# Patient Record
Sex: Female | Born: 1950 | Race: Black or African American | Hispanic: No | Marital: Married | Smoking: Never smoker
Health system: Southern US, Community
[De-identification: ages and names within clinical notes are randomized; demographics above are authoritative.]

## PROBLEM LIST (undated history)

## (undated) DIAGNOSIS — I82409 Acute embolism and thrombosis of unspecified deep veins of unspecified lower extremity: Secondary | ICD-10-CM

## (undated) DIAGNOSIS — E7800 Pure hypercholesterolemia, unspecified: Secondary | ICD-10-CM

## (undated) DIAGNOSIS — K219 Gastro-esophageal reflux disease without esophagitis: Secondary | ICD-10-CM

## (undated) DIAGNOSIS — M7989 Other specified soft tissue disorders: Secondary | ICD-10-CM

## (undated) DIAGNOSIS — C539 Malignant neoplasm of cervix uteri, unspecified: Secondary | ICD-10-CM

## (undated) DIAGNOSIS — Z972 Presence of dental prosthetic device (complete) (partial): Secondary | ICD-10-CM

## (undated) DIAGNOSIS — R42 Dizziness and giddiness: Secondary | ICD-10-CM

## (undated) DIAGNOSIS — I10 Essential (primary) hypertension: Secondary | ICD-10-CM

## (undated) HISTORY — PX: BREAST EXCISIONAL BIOPSY: SUR124

## (undated) HISTORY — PX: OOPHORECTOMY: SHX86

## (undated) HISTORY — PX: ABDOMINAL HYSTERECTOMY: SHX81

## (undated) HISTORY — PX: COLONOSCOPY: SHX174

---

## 1994-11-29 HISTORY — PX: BREAST EXCISIONAL BIOPSY: SUR124

## 2004-12-01 ENCOUNTER — Ambulatory Visit: Payer: Self-pay | Admitting: Internal Medicine

## 2006-01-20 ENCOUNTER — Ambulatory Visit: Payer: Self-pay | Admitting: Internal Medicine

## 2006-12-07 ENCOUNTER — Ambulatory Visit: Payer: Self-pay | Admitting: Internal Medicine

## 2007-05-04 ENCOUNTER — Ambulatory Visit: Payer: Self-pay | Admitting: Internal Medicine

## 2008-05-07 ENCOUNTER — Ambulatory Visit: Payer: Self-pay | Admitting: Internal Medicine

## 2009-05-22 ENCOUNTER — Ambulatory Visit: Payer: Self-pay | Admitting: Internal Medicine

## 2010-05-27 ENCOUNTER — Ambulatory Visit: Payer: Self-pay | Admitting: Internal Medicine

## 2011-06-24 ENCOUNTER — Ambulatory Visit: Payer: Self-pay | Admitting: Internal Medicine

## 2012-06-26 ENCOUNTER — Ambulatory Visit: Payer: Self-pay | Admitting: Internal Medicine

## 2013-06-11 ENCOUNTER — Emergency Department: Payer: Self-pay | Admitting: Unknown Physician Specialty

## 2013-06-27 ENCOUNTER — Ambulatory Visit: Payer: Self-pay | Admitting: Internal Medicine

## 2014-04-05 ENCOUNTER — Ambulatory Visit: Payer: Self-pay | Admitting: Gastroenterology

## 2014-07-01 ENCOUNTER — Ambulatory Visit: Payer: Self-pay | Admitting: Nurse Practitioner

## 2014-07-03 ENCOUNTER — Ambulatory Visit: Payer: Self-pay | Admitting: Nurse Practitioner

## 2015-06-04 ENCOUNTER — Other Ambulatory Visit: Payer: Self-pay | Admitting: Internal Medicine

## 2015-06-04 DIAGNOSIS — Z1231 Encounter for screening mammogram for malignant neoplasm of breast: Secondary | ICD-10-CM

## 2015-07-03 ENCOUNTER — Ambulatory Visit
Admission: RE | Admit: 2015-07-03 | Discharge: 2015-07-03 | Disposition: A | Discharge status: Home / Self Care | Payer: BLUE CROSS/BLUE SHIELD | Source: Ambulatory Visit | Attending: Internal Medicine | Admitting: Internal Medicine

## 2015-07-03 DIAGNOSIS — Z1231 Encounter for screening mammogram for malignant neoplasm of breast: Secondary | ICD-10-CM | POA: Insufficient documentation

## 2016-06-22 ENCOUNTER — Other Ambulatory Visit: Payer: Self-pay | Admitting: Nurse Practitioner

## 2016-06-22 DIAGNOSIS — Z1231 Encounter for screening mammogram for malignant neoplasm of breast: Secondary | ICD-10-CM

## 2016-07-06 ENCOUNTER — Other Ambulatory Visit: Payer: Self-pay | Admitting: Nurse Practitioner

## 2016-07-06 ENCOUNTER — Ambulatory Visit
Admission: RE | Admit: 2016-07-06 | Discharge: 2016-07-06 | Disposition: A | Discharge status: Home / Self Care | Payer: BLUE CROSS/BLUE SHIELD | Source: Ambulatory Visit | Attending: Nurse Practitioner | Admitting: Nurse Practitioner

## 2016-07-06 DIAGNOSIS — Z1231 Encounter for screening mammogram for malignant neoplasm of breast: Secondary | ICD-10-CM

## 2017-05-31 ENCOUNTER — Other Ambulatory Visit: Payer: Self-pay | Admitting: Nurse Practitioner

## 2017-05-31 DIAGNOSIS — Z1231 Encounter for screening mammogram for malignant neoplasm of breast: Secondary | ICD-10-CM

## 2017-07-07 ENCOUNTER — Ambulatory Visit
Admission: RE | Admit: 2017-07-07 | Discharge: 2017-07-07 | Disposition: A | Discharge status: Home / Self Care | Payer: BLUE CROSS/BLUE SHIELD | Source: Ambulatory Visit | Attending: Nurse Practitioner | Admitting: Nurse Practitioner

## 2017-07-07 DIAGNOSIS — Z1231 Encounter for screening mammogram for malignant neoplasm of breast: Secondary | ICD-10-CM | POA: Insufficient documentation

## 2017-10-05 ENCOUNTER — Other Ambulatory Visit: Payer: Self-pay

## 2017-10-10 NOTE — Discharge Instructions (Signed)

## 2017-10-11 ENCOUNTER — Ambulatory Visit
Admission: RE | Admit: 2017-10-11 | Discharge: 2017-10-11 | Disposition: A | Discharge status: Home / Self Care | Payer: BLUE CROSS/BLUE SHIELD | Source: Ambulatory Visit | Attending: Ophthalmology | Admitting: Ophthalmology

## 2017-10-11 ENCOUNTER — Ambulatory Visit: Payer: BLUE CROSS/BLUE SHIELD | Admitting: Student in an Organized Health Care Education/Training Program

## 2017-10-11 ENCOUNTER — Encounter
Admission: RE | Disposition: A | Discharge status: Home / Self Care | Payer: Self-pay | Source: Ambulatory Visit | Attending: Ophthalmology

## 2017-10-11 DIAGNOSIS — H2512 Age-related nuclear cataract, left eye: Secondary | ICD-10-CM | POA: Insufficient documentation

## 2017-10-11 DIAGNOSIS — K219 Gastro-esophageal reflux disease without esophagitis: Secondary | ICD-10-CM | POA: Insufficient documentation

## 2017-10-11 DIAGNOSIS — H401121 Primary open-angle glaucoma, left eye, mild stage: Secondary | ICD-10-CM | POA: Diagnosis not present

## 2017-10-11 DIAGNOSIS — I10 Essential (primary) hypertension: Secondary | ICD-10-CM | POA: Diagnosis not present

## 2017-10-11 HISTORY — DX: Gastro-esophageal reflux disease without esophagitis: K21.9

## 2017-10-11 HISTORY — DX: Presence of dental prosthetic device (complete) (partial): Z97.2

## 2017-10-11 HISTORY — DX: Dizziness and giddiness: R42

## 2017-10-11 HISTORY — DX: Pure hypercholesterolemia, unspecified: E78.00

## 2017-10-11 HISTORY — DX: Essential (primary) hypertension: I10

## 2017-10-11 HISTORY — PX: CATARACT EXTRACTION W/PHACO: SHX586

## 2017-10-11 HISTORY — DX: Acute embolism and thrombosis of unspecified deep veins of unspecified lower extremity: I82.409

## 2017-10-11 SURGERY — PHACOEMULSIFICATION, CATARACT, WITH IOL INSERTION
Anesthesia: General | Laterality: Left | Wound class: Clean

## 2017-10-11 MED ORDER — BSS IO SOLN
INTRAOCULAR | Status: DC | PRN
  Administered 2017-10-11: 72 mL via OPHTHALMIC

## 2017-10-11 MED ORDER — MIDAZOLAM HCL 2 MG/2ML IJ SOLN
INTRAMUSCULAR | Status: DC | PRN
  Administered 2017-10-11: 2 mg via INTRAVENOUS

## 2017-10-11 MED ORDER — SODIUM HYALURONATE 10 MG/ML IO SOLN
INTRAOCULAR | Status: DC | PRN
  Administered 2017-10-11: 0.55 mL via INTRAOCULAR

## 2017-10-11 MED ORDER — SODIUM HYALURONATE 23 MG/ML IO SOLN
INTRAOCULAR | Status: DC | PRN
  Administered 2017-10-11: 0.6 mL via INTRAOCULAR

## 2017-10-11 MED ORDER — MEPERIDINE HCL 25 MG/ML IJ SOLN: 6.2500 mg | INTRAMUSCULAR | Status: DC | PRN

## 2017-10-11 MED ORDER — OXYCODONE HCL 5 MG/5ML PO SOLN: 5.0000 mg | Freq: Once | ORAL | Status: DC | PRN

## 2017-10-11 MED ORDER — ARMC OPHTHALMIC DILATING DROPS
1.0000 "application " | OPHTHALMIC | Status: DC | PRN
  Administered 2017-10-11 (×3): 1 via OPHTHALMIC

## 2017-10-11 MED ORDER — BUPIVACAINE HCL (PF) 0.75 % IJ SOLN
INTRAMUSCULAR | Status: DC | PRN
  Administered 2017-10-11: 3 mL via OPHTHALMIC

## 2017-10-11 MED ORDER — PROMETHAZINE HCL 25 MG/ML IJ SOLN: 6.2500 mg | INTRAMUSCULAR | Status: DC | PRN

## 2017-10-11 MED ORDER — LIDOCAINE HCL (PF) 2 % IJ SOLN
INTRAOCULAR | Status: DC | PRN
  Administered 2017-10-11: 1 mL via INTRAOCULAR

## 2017-10-11 MED ORDER — ALFENTANIL 500 MCG/ML IJ INJ
INJECTION | INTRAVENOUS | Status: DC | PRN
  Administered 2017-10-11: 1000 ug via INTRAVENOUS

## 2017-10-11 MED ORDER — ERYTHROMYCIN 5 MG/GM OP OINT
TOPICAL_OINTMENT | OPHTHALMIC | Status: DC | PRN
  Administered 2017-10-11: 1 via OPHTHALMIC

## 2017-10-11 MED ORDER — FENTANYL CITRATE (PF) 100 MCG/2ML IJ SOLN
25.0000 ug | INTRAMUSCULAR | Status: DC | PRN
  Administered 2017-10-11: 50 ug via INTRAVENOUS

## 2017-10-11 MED ORDER — MOXIFLOXACIN HCL 0.5 % OP SOLN
OPHTHALMIC | Status: DC | PRN
  Administered 2017-10-11: 1 [drp] via OPHTHALMIC

## 2017-10-11 MED ORDER — LACTATED RINGERS IV SOLN: 10.0000 mL/h | INTRAVENOUS | Status: DC

## 2017-10-11 MED ORDER — OXYCODONE HCL 5 MG PO TABS: 5.0000 mg | ORAL_TABLET | Freq: Once | ORAL | Status: DC | PRN

## 2017-10-11 SURGICAL SUPPLY — 18 items
CANNULA ANT/CHMB 27GA (MISCELLANEOUS) ×3 IMPLANT
DISSECTOR HYDRO NUCLEUS 50X22 (MISCELLANEOUS) ×3 IMPLANT
GLOVE BIO SURGEON STRL SZ8 (GLOVE) ×3 IMPLANT
GLOVE SURG LX 7.5 STRW (GLOVE) ×2
GLOVE SURG LX STRL 7.5 STRW (GLOVE) ×1 IMPLANT
GOWN STRL REUS W/ TWL LRG LVL3 (GOWN DISPOSABLE) ×2 IMPLANT
GOWN STRL REUS W/TWL LRG LVL3 (GOWN DISPOSABLE) ×4
ISTENT OPTHAL 1X.33 ADULT LF (Permanent Stent) ×3 IMPLANT
LENS IOL TECNIS ITEC 22.5 (Intraocular Lens) ×3 IMPLANT
MARKER SKIN DUAL TIP RULER LAB (MISCELLANEOUS) ×3 IMPLANT
PACK CATARACT (MISCELLANEOUS) ×3 IMPLANT
PACK DR. KING ARMS (PACKS) ×3 IMPLANT
PACK EYE AFTER SURG (MISCELLANEOUS) ×3 IMPLANT
STENT 'ISTENT OPTH 1X.33 ADLT (Permanent Stent) ×1 IMPLANT
SYR 3ML LL SCALE MARK (SYRINGE) ×3 IMPLANT
SYR TB 1ML LUER SLIP (SYRINGE) ×3 IMPLANT
WATER STERILE IRR 500ML POUR (IV SOLUTION) ×3 IMPLANT
WIPE NON LINTING 3.25X3.25 (MISCELLANEOUS) ×3 IMPLANT

## 2017-10-11 NOTE — Anesthesia Postprocedure Evaluation (Signed)
Anesthesia Post Note  Patient: Teresa Cunningham  Procedure(s) Performed: CATARACT EXTRACTION PHACO AND INTRAOCULAR LENS PLACEMENT (IOC) LEFT ISTENT (Left )  Patient location during evaluation: PACU Anesthesia Type: General Level of consciousness: awake and alert Pain management: pain level controlled Vital Signs Assessment: post-procedure vital signs reviewed and stable Respiratory status: spontaneous breathing, nonlabored ventilation, respiratory function stable and patient connected to nasal cannula oxygen Cardiovascular status: blood pressure returned to baseline and stable Postop Assessment: no apparent nausea or vomiting Anesthetic complications: no    Domique Clapper ELAINE

## 2017-10-11 NOTE — Transfer of Care (Signed)
Immediate Anesthesia Transfer of Care Note  Patient: Teresa Cunningham  Procedure(s) Performed: CATARACT EXTRACTION PHACO AND INTRAOCULAR LENS PLACEMENT (IOC) LEFT ISTENT (Left )  Patient Location: PACU  Anesthesia Type: General  Level of Consciousness: awake, alert  and patient cooperative  Airway and Oxygen Therapy: Patient Spontanous Breathing and Patient connected to supplemental oxygen  Post-op Assessment: Post-op Vital signs reviewed, Patient's Cardiovascular Status Stable, Respiratory Function Stable, Patent Airway and No signs of Nausea or vomiting  Post-op Vital Signs: Reviewed and stable  Complications: No apparent anesthesia complications

## 2017-10-11 NOTE — Anesthesia Preprocedure Evaluation (Signed)
Anesthesia Evaluation  Patient identified by MRN, date of birth, ID band Patient awake    Reviewed: H&P , NPO status , Patient's Chart, lab work & pertinent test results, reviewed documented beta blocker date and time   History of Anesthesia Complications Negative for: history of anesthetic complications  Airway Mallampati: II  TM Distance: >3 FB Neck ROM: full    Dental no notable dental hx.    Pulmonary neg pulmonary ROS,    Pulmonary exam normal breath sounds clear to auscultation       Cardiovascular Exercise Tolerance: Good hypertension,  Rhythm:regular Rate:Normal     Neuro/Psych negative neurological ROS  negative psych ROS   GI/Hepatic Neg liver ROS, GERD  ,  Endo/Other  negative endocrine ROS  Renal/GU negative Renal ROS  negative genitourinary   Musculoskeletal   Abdominal   Peds  Hematology negative hematology ROS (+)   Anesthesia Other Findings   Reproductive/Obstetrics negative OB ROS                            Anesthesia Physical Anesthesia Plan  ASA: II  Anesthesia Plan: General   Post-op Pain Management:    Induction:   PONV Risk Score and Plan:   Airway Management Planned:   Additional Equipment:   Intra-op Plan:   Post-operative Plan:   Informed Consent: I have reviewed the patients History and Physical, chart, labs and discussed the procedure including the risks, benefits and alternatives for the proposed anesthesia with the patient or authorized representative who has indicated his/her understanding and acceptance.   Dental Advisory Given  Plan Discussed with: CRNA  Anesthesia Plan Comments:         Anesthesia Quick Evaluation

## 2017-10-11 NOTE — Anesthesia Procedure Notes (Signed)
Procedure Name: MAC Performed by: Orvil Faraone, CRNA Pre-anesthesia Checklist: Patient identified, Emergency Drugs available, Suction available, Timeout performed and Patient being monitored Patient Re-evaluated:Patient Re-evaluated prior to induction Oxygen Delivery Method: Nasal cannula Placement Confirmation: positive ETCO2       

## 2017-10-11 NOTE — H&P (Signed)
The History and Physical notes are on paper, have been signed, and are to be scanned.   I have examined the patient and there are no changes to the H&P.   Benay Pillow 10/11/2017 9:14 AM

## 2017-10-11 NOTE — Op Note (Signed)
OPERATIVE NOTE  Teresa Cunningham [id number] 10/11/2017  PREOPERATIVE DIAGNOSIS:   1.  Mild open angle glaucoma, left eye. W09.8119 2.  Nuclear sclerotic cataract left eye.  H25.12   POSTOPERATIVE DIAGNOSIS:    same.   PROCEDURE:   1.  Placement of trabecular bypass stent (istent). CPT 0191T 2. Phacoemusification with posterior chamber intraocular lens placement of the left eye  CPT [phone]   LENS: Implant Name Type Inv. Item Serial No. Manufacturer Lot No. LRB No. Used  ISTENT OPTHAL 1X.33 ADULT LF - [id number] Permanent Stent ISTENT OPTHAL 1X.33 ADULT LF [id number] [employer] [id number] Left 1  LENS IOL DIOP 22.5 - [id number] Intraocular Lens LENS IOL DIOP 22.5 [id number] AMO  Left 1         ULTRASOUND TIME: 0 minutes 26 seconds.  CDE 3.88   SURGEON:  Benay Pillow, MD, MPH  CRNA: Nelda Marseille, CRNA; Allean Found, CRNA   ANESTHESIA:  MAC with retrobulbar block of 50/50% mix of 0.75% bupivicaine, and 4% preservative free lidocaine with a small amount of vitrase and intracameral preservative-free intracameral lidocaine 4%.  ESTIMATED BLOOD LOSS: less than 1 mL.   COMPLICATIONS:  None.   DESCRIPTION OF PROCEDURE:  The patient was identified in the holding room and transported to the operating room.  The patient was placed in the supine position under the operating microscope.  A retrobulbar block was performed with 3 cc.  The left eye was prepped and draped in the usual sterile ophthalmic fashion.   A 1.0 millimeter clear-corneal paracentesis was made at the 5:00 position. 0.5 ml of preservative-free 1% lidocaine with epinephrine was injected into the anterior chamber.  The anterior chamber was filled with Healon 5 viscoelastic.  A 2.4 millimeter keratome was used to make a near-clear corneal incision at the 2:30 position.   Attention was turned to the istent.  The patients head was turned and the microscope was tilted to 035 degrees.  Ocular instruments/Glaukos  OAL/H2 gonioprism was used with IPC05 (iclip) coupled with Healon 5 on the cornea was used to visualize the trabecular meshwork. The istent was opened and introduced into the eye.  The meshwork was engaged with the tip of the iStent and then using the inserter, the iStent slid down Schlemm's canal until the stent was well seated.  The stent was then released from the inserter.  The iStent was inspected and in good position, the position seemed perhaps slightly posterior, but was well seated.  Next, attention was turned to the phacoemulsification A curvilinear capsulorrhexis was made with a cystotome and capsulorrhexis forceps.  Balanced salt solution was used to hydrodissect and hydrodelineate the nucleus.   Phacoemulsification was then used in stop and chop fashion to remove the lens nucleus and epinucleus.  The remaining cortex was then removed using the irrigation and aspiration handpiece. Healon was then placed into the capsular bag to distend it for lens placement.  A lens was then injected into the capsular bag.  The remaining viscoelastic was aspirated.   Wounds were hydrated with balanced salt solution.  The anterior chamber was inflated to a physiologic pressure with balanced salt solution.    Intracameral vigamox 0.1 mL undiluted was injected into the eye and a drop placed onto the ocular surface.  No wound leaks were noted.  Maxitrol ointment, a patch and shield were placed.  The patient was taken to the recovery room in stable condition without complications of anesthesia or surgery   Benay Pillow 10/11/2017, 10:13 AM

## 2017-10-12 ENCOUNTER — Encounter: Payer: Self-pay | Admitting: Ophthalmology

## 2018-04-05 DIAGNOSIS — J069 Acute upper respiratory infection, unspecified: Secondary | ICD-10-CM | POA: Diagnosis not present

## 2018-04-05 DIAGNOSIS — J40 Bronchitis, not specified as acute or chronic: Secondary | ICD-10-CM | POA: Diagnosis not present

## 2018-04-05 DIAGNOSIS — J309 Allergic rhinitis, unspecified: Secondary | ICD-10-CM | POA: Diagnosis not present

## 2018-04-05 DIAGNOSIS — I10 Essential (primary) hypertension: Secondary | ICD-10-CM | POA: Diagnosis not present

## 2018-04-05 DIAGNOSIS — R0600 Dyspnea, unspecified: Secondary | ICD-10-CM | POA: Diagnosis not present

## 2018-04-12 DIAGNOSIS — R7303 Prediabetes: Secondary | ICD-10-CM | POA: Diagnosis not present

## 2018-04-12 DIAGNOSIS — K219 Gastro-esophageal reflux disease without esophagitis: Secondary | ICD-10-CM | POA: Diagnosis not present

## 2018-04-12 DIAGNOSIS — E785 Hyperlipidemia, unspecified: Secondary | ICD-10-CM | POA: Diagnosis not present

## 2018-04-12 DIAGNOSIS — I10 Essential (primary) hypertension: Secondary | ICD-10-CM | POA: Diagnosis not present

## 2018-05-02 DIAGNOSIS — H401131 Primary open-angle glaucoma, bilateral, mild stage: Secondary | ICD-10-CM | POA: Diagnosis not present

## 2018-05-09 DIAGNOSIS — H401131 Primary open-angle glaucoma, bilateral, mild stage: Secondary | ICD-10-CM | POA: Diagnosis not present

## 2018-06-05 ENCOUNTER — Other Ambulatory Visit: Payer: Self-pay | Admitting: Nurse Practitioner

## 2018-06-05 DIAGNOSIS — Z1231 Encounter for screening mammogram for malignant neoplasm of breast: Secondary | ICD-10-CM

## 2018-07-10 ENCOUNTER — Ambulatory Visit
Admission: RE | Admit: 2018-07-10 | Discharge: 2018-07-10 | Disposition: A | Discharge status: Home / Self Care | Payer: PPO | Source: Ambulatory Visit | Attending: Nurse Practitioner | Admitting: Nurse Practitioner

## 2018-07-10 DIAGNOSIS — Z1231 Encounter for screening mammogram for malignant neoplasm of breast: Secondary | ICD-10-CM | POA: Insufficient documentation

## 2018-07-10 DIAGNOSIS — R7303 Prediabetes: Secondary | ICD-10-CM | POA: Diagnosis not present

## 2018-07-10 DIAGNOSIS — I10 Essential (primary) hypertension: Secondary | ICD-10-CM | POA: Diagnosis not present

## 2018-07-10 DIAGNOSIS — E785 Hyperlipidemia, unspecified: Secondary | ICD-10-CM | POA: Diagnosis not present

## 2018-07-12 ENCOUNTER — Other Ambulatory Visit: Payer: Self-pay | Admitting: Nurse Practitioner

## 2018-07-12 DIAGNOSIS — R7303 Prediabetes: Secondary | ICD-10-CM | POA: Diagnosis not present

## 2018-07-12 DIAGNOSIS — I10 Essential (primary) hypertension: Secondary | ICD-10-CM | POA: Diagnosis not present

## 2018-07-12 DIAGNOSIS — R609 Edema, unspecified: Secondary | ICD-10-CM | POA: Diagnosis not present

## 2018-07-12 DIAGNOSIS — E785 Hyperlipidemia, unspecified: Secondary | ICD-10-CM | POA: Diagnosis not present

## 2018-07-12 DIAGNOSIS — R928 Other abnormal and inconclusive findings on diagnostic imaging of breast: Secondary | ICD-10-CM

## 2018-07-12 DIAGNOSIS — N6489 Other specified disorders of breast: Secondary | ICD-10-CM

## 2018-07-12 DIAGNOSIS — R5383 Other fatigue: Secondary | ICD-10-CM | POA: Diagnosis not present

## 2018-07-18 ENCOUNTER — Ambulatory Visit
Admission: RE | Admit: 2018-07-18 | Discharge: 2018-07-18 | Disposition: A | Discharge status: Home / Self Care | Payer: PPO | Source: Ambulatory Visit | Attending: Nurse Practitioner | Admitting: Nurse Practitioner

## 2018-07-18 DIAGNOSIS — N6489 Other specified disorders of breast: Secondary | ICD-10-CM

## 2018-07-18 DIAGNOSIS — R928 Other abnormal and inconclusive findings on diagnostic imaging of breast: Secondary | ICD-10-CM | POA: Diagnosis not present

## 2018-11-08 DIAGNOSIS — H401131 Primary open-angle glaucoma, bilateral, mild stage: Secondary | ICD-10-CM | POA: Diagnosis not present

## 2018-12-12 DIAGNOSIS — R5383 Other fatigue: Secondary | ICD-10-CM | POA: Diagnosis not present

## 2018-12-12 DIAGNOSIS — E785 Hyperlipidemia, unspecified: Secondary | ICD-10-CM | POA: Diagnosis not present

## 2018-12-12 DIAGNOSIS — R7303 Prediabetes: Secondary | ICD-10-CM | POA: Diagnosis not present

## 2018-12-12 DIAGNOSIS — I10 Essential (primary) hypertension: Secondary | ICD-10-CM | POA: Diagnosis not present

## 2018-12-20 DIAGNOSIS — J309 Allergic rhinitis, unspecified: Secondary | ICD-10-CM | POA: Diagnosis not present

## 2018-12-20 DIAGNOSIS — I10 Essential (primary) hypertension: Secondary | ICD-10-CM | POA: Diagnosis not present

## 2018-12-20 DIAGNOSIS — R7303 Prediabetes: Secondary | ICD-10-CM | POA: Diagnosis not present

## 2018-12-20 DIAGNOSIS — E785 Hyperlipidemia, unspecified: Secondary | ICD-10-CM | POA: Diagnosis not present

## 2019-01-02 DIAGNOSIS — Z13820 Encounter for screening for osteoporosis: Secondary | ICD-10-CM | POA: Diagnosis not present

## 2019-05-01 DIAGNOSIS — I10 Essential (primary) hypertension: Secondary | ICD-10-CM | POA: Diagnosis not present

## 2019-05-01 DIAGNOSIS — R7303 Prediabetes: Secondary | ICD-10-CM | POA: Diagnosis not present

## 2019-05-01 DIAGNOSIS — E785 Hyperlipidemia, unspecified: Secondary | ICD-10-CM | POA: Diagnosis not present

## 2019-05-07 ENCOUNTER — Other Ambulatory Visit: Payer: Self-pay | Admitting: Nurse Practitioner

## 2019-05-07 DIAGNOSIS — R5383 Other fatigue: Secondary | ICD-10-CM | POA: Diagnosis not present

## 2019-05-07 DIAGNOSIS — I10 Essential (primary) hypertension: Secondary | ICD-10-CM | POA: Diagnosis not present

## 2019-05-07 DIAGNOSIS — G4700 Insomnia, unspecified: Secondary | ICD-10-CM | POA: Diagnosis not present

## 2019-05-07 DIAGNOSIS — Z1231 Encounter for screening mammogram for malignant neoplasm of breast: Secondary | ICD-10-CM

## 2019-05-07 DIAGNOSIS — E785 Hyperlipidemia, unspecified: Secondary | ICD-10-CM | POA: Diagnosis not present

## 2019-05-07 DIAGNOSIS — H401131 Primary open-angle glaucoma, bilateral, mild stage: Secondary | ICD-10-CM | POA: Diagnosis not present

## 2019-05-07 DIAGNOSIS — R7303 Prediabetes: Secondary | ICD-10-CM | POA: Diagnosis not present

## 2019-05-16 DIAGNOSIS — H401131 Primary open-angle glaucoma, bilateral, mild stage: Secondary | ICD-10-CM | POA: Diagnosis not present

## 2019-07-12 ENCOUNTER — Ambulatory Visit
Admission: RE | Admit: 2019-07-12 | Discharge: 2019-07-12 | Disposition: A | Discharge status: Home / Self Care | Payer: PPO | Source: Ambulatory Visit | Attending: Nurse Practitioner | Admitting: Nurse Practitioner

## 2019-07-12 DIAGNOSIS — Z1231 Encounter for screening mammogram for malignant neoplasm of breast: Secondary | ICD-10-CM | POA: Diagnosis not present

## 2019-08-08 DIAGNOSIS — H2511 Age-related nuclear cataract, right eye: Secondary | ICD-10-CM | POA: Diagnosis not present

## 2019-08-08 DIAGNOSIS — I10 Essential (primary) hypertension: Secondary | ICD-10-CM | POA: Diagnosis not present

## 2019-08-13 ENCOUNTER — Other Ambulatory Visit: Payer: Self-pay

## 2019-08-13 ENCOUNTER — Encounter: Payer: Self-pay | Admitting: *Deleted

## 2019-08-13 NOTE — Anesthesia Preprocedure Evaluation (Addendum)
Anesthesia Evaluation  Patient identified by MRN, date of birth, ID band Patient awake    Reviewed: Allergy & Precautions, NPO status , Patient's Chart, lab work & pertinent test results  History of Anesthesia Complications Negative for: history of anesthetic complications  Airway Mallampati: II  TM Distance: >3 FB Neck ROM: Full    Dental  (+) Partial Lower   Pulmonary    breath sounds clear to auscultation       Cardiovascular hypertension, (-) angina(-) DOE  Rhythm:Regular Rate:Normal   HLD   Neuro/Psych    GI/Hepatic GERD  Controlled,  Endo/Other    Renal/GU      Musculoskeletal   Abdominal   Peds  Hematology  (+) Blood dyscrasia (H/o DVT), ,   Anesthesia Other Findings Cervical cancer   Reproductive/Obstetrics                            Anesthesia Physical Anesthesia Plan  ASA: II  Anesthesia Plan: MAC   Post-op Pain Management:    Induction: Intravenous  PONV Risk Score and Plan: 2 and TIVA and Midazolam  Airway Management Planned: Nasal Cannula  Additional Equipment:   Intra-op Plan:   Post-operative Plan:   Informed Consent: I have reviewed the patients History and Physical, chart, labs and discussed the procedure including the risks, benefits and alternatives for the proposed anesthesia with the patient or authorized representative who has indicated his/her understanding and acceptance.       Plan Discussed with: CRNA and Anesthesiologist  Anesthesia Plan Comments:         Anesthesia Quick Evaluation

## 2019-08-16 ENCOUNTER — Other Ambulatory Visit: Payer: Self-pay

## 2019-08-16 ENCOUNTER — Other Ambulatory Visit
Admission: RE | Admit: 2019-08-16 | Discharge: 2019-08-16 | Disposition: A | Discharge status: Home / Self Care | Payer: PPO | Source: Ambulatory Visit | Attending: Ophthalmology | Admitting: Ophthalmology

## 2019-08-16 DIAGNOSIS — Z20828 Contact with and (suspected) exposure to other viral communicable diseases: Secondary | ICD-10-CM | POA: Diagnosis not present

## 2019-08-16 DIAGNOSIS — Z01812 Encounter for preprocedural laboratory examination: Secondary | ICD-10-CM | POA: Diagnosis not present

## 2019-08-16 LAB — SARS CORONAVIRUS 2 (TAT 6-24 HRS): SARS Coronavirus 2: NEGATIVE

## 2019-08-16 NOTE — Discharge Instructions (Signed)

## 2019-08-20 ENCOUNTER — Other Ambulatory Visit: Payer: Self-pay

## 2019-08-20 ENCOUNTER — Encounter
Admission: RE | Disposition: A | Discharge status: Home / Self Care | Payer: Self-pay | Source: Home / Self Care | Attending: Ophthalmology

## 2019-08-20 ENCOUNTER — Ambulatory Visit: Payer: PPO | Admitting: Anesthesiology

## 2019-08-20 ENCOUNTER — Ambulatory Visit
Admission: RE | Admit: 2019-08-20 | Discharge: 2019-08-20 | Disposition: A | Discharge status: Home / Self Care | Payer: PPO | Attending: Ophthalmology | Admitting: Ophthalmology

## 2019-08-20 DIAGNOSIS — H401111 Primary open-angle glaucoma, right eye, mild stage: Secondary | ICD-10-CM | POA: Insufficient documentation

## 2019-08-20 DIAGNOSIS — E7800 Pure hypercholesterolemia, unspecified: Secondary | ICD-10-CM | POA: Diagnosis not present

## 2019-08-20 DIAGNOSIS — H2511 Age-related nuclear cataract, right eye: Secondary | ICD-10-CM | POA: Diagnosis not present

## 2019-08-20 DIAGNOSIS — K219 Gastro-esophageal reflux disease without esophagitis: Secondary | ICD-10-CM | POA: Diagnosis not present

## 2019-08-20 DIAGNOSIS — Z8541 Personal history of malignant neoplasm of cervix uteri: Secondary | ICD-10-CM | POA: Diagnosis not present

## 2019-08-20 DIAGNOSIS — Z79899 Other long term (current) drug therapy: Secondary | ICD-10-CM | POA: Diagnosis not present

## 2019-08-20 DIAGNOSIS — Z86718 Personal history of other venous thrombosis and embolism: Secondary | ICD-10-CM | POA: Diagnosis not present

## 2019-08-20 DIAGNOSIS — I10 Essential (primary) hypertension: Secondary | ICD-10-CM | POA: Diagnosis not present

## 2019-08-20 DIAGNOSIS — H25811 Combined forms of age-related cataract, right eye: Secondary | ICD-10-CM | POA: Diagnosis not present

## 2019-08-20 HISTORY — DX: Other specified soft tissue disorders: M79.89

## 2019-08-20 HISTORY — PX: INSERTION OF ANTERIOR SEGMENT AQUEOUS DRAINAGE DEVICE (ISTENT): SHX6783

## 2019-08-20 HISTORY — DX: Malignant neoplasm of cervix uteri, unspecified: C53.9

## 2019-08-20 HISTORY — PX: CATARACT EXTRACTION W/PHACO: SHX586

## 2019-08-20 SURGERY — PHACOEMULSIFICATION, CATARACT, WITH IOL INSERTION
Anesthesia: Monitor Anesthesia Care | Site: Eye | Laterality: Right

## 2019-08-20 MED ORDER — ACETAMINOPHEN 10 MG/ML IV SOLN: 1000.0000 mg | Freq: Once | INTRAVENOUS | Status: DC | PRN

## 2019-08-20 MED ORDER — TETRACAINE HCL 0.5 % OP SOLN
1.0000 [drp] | OPHTHALMIC | Status: DC | PRN
  Administered 2019-08-20 (×3): 1 [drp] via OPHTHALMIC

## 2019-08-20 MED ORDER — ARMC OPHTHALMIC DILATING DROPS
1.0000 "application " | OPHTHALMIC | Status: DC | PRN
  Administered 2019-08-20 (×3): 1 via OPHTHALMIC

## 2019-08-20 MED ORDER — LACTATED RINGERS IV SOLN: 100.0000 mL/h | INTRAVENOUS | Status: DC

## 2019-08-20 MED ORDER — ONDANSETRON HCL 4 MG/2ML IJ SOLN: 4.0000 mg | Freq: Once | INTRAMUSCULAR | Status: DC | PRN

## 2019-08-20 MED ORDER — MOXIFLOXACIN HCL 0.5 % OP SOLN
OPHTHALMIC | Status: DC | PRN
  Administered 2019-08-20: 0.2 mL via OPHTHALMIC

## 2019-08-20 MED ORDER — LIDOCAINE HCL (PF) 2 % IJ SOLN
INTRAOCULAR | Status: DC | PRN
  Administered 2019-08-20: 12:00:00 2 mL via INTRAOCULAR

## 2019-08-20 MED ORDER — MIDAZOLAM HCL 2 MG/2ML IJ SOLN
INTRAMUSCULAR | Status: DC | PRN
  Administered 2019-08-20: 2 mg via INTRAVENOUS

## 2019-08-20 MED ORDER — FENTANYL CITRATE (PF) 100 MCG/2ML IJ SOLN
INTRAMUSCULAR | Status: DC | PRN
  Administered 2019-08-20 (×2): 50 ug via INTRAVENOUS

## 2019-08-20 MED ORDER — EPINEPHRINE PF 1 MG/ML IJ SOLN
INTRAOCULAR | Status: DC | PRN
  Administered 2019-08-20: 12:00:00 60 mL via OPHTHALMIC

## 2019-08-20 MED ORDER — SODIUM HYALURONATE 23 MG/ML IO SOLN
INTRAOCULAR | Status: DC | PRN
  Administered 2019-08-20: 0.6 mL via INTRAOCULAR

## 2019-08-20 MED ORDER — SODIUM HYALURONATE 10 MG/ML IO SOLN
INTRAOCULAR | Status: DC | PRN
  Administered 2019-08-20: 0.55 mL via INTRAOCULAR

## 2019-08-20 SURGICAL SUPPLY — 22 items
CANNULA ANT/CHMB 27G (MISCELLANEOUS) ×2 IMPLANT
CANNULA ANT/CHMB 27GA (MISCELLANEOUS) ×6 IMPLANT
DEVICE INJECT ISTENT W (Stent) IMPLANT
DISSECTOR HYDRO NUCLEUS 50X22 (MISCELLANEOUS) ×3 IMPLANT
GLOVE SURG LX 7.5 STRW (GLOVE) ×2
GLOVE SURG LX STRL 7.5 STRW (GLOVE) ×1 IMPLANT
GLOVE SURG SYN 8.5  E (GLOVE) ×2
GLOVE SURG SYN 8.5 E (GLOVE) ×1 IMPLANT
GLOVE SURG SYN 8.5 PF PI (GLOVE) ×1 IMPLANT
GOWN STRL REUS W/ TWL LRG LVL3 (GOWN DISPOSABLE) ×2 IMPLANT
GOWN STRL REUS W/TWL LRG LVL3 (GOWN DISPOSABLE) ×4
ICLIP (OPHTHALMIC RELATED) ×2 IMPLANT
INJECT ISTENT W (Stent) ×3 IMPLANT
LENS IOL TECNIS ITEC 23.0 (Intraocular Lens) ×2 IMPLANT
MARKER SKIN DUAL TIP RULER LAB (MISCELLANEOUS) ×3 IMPLANT
PACK DR. KING ARMS (PACKS) ×3 IMPLANT
PACK EYE AFTER SURG (MISCELLANEOUS) ×3 IMPLANT
PACK OPTHALMIC (MISCELLANEOUS) ×3 IMPLANT
SYR 3ML LL SCALE MARK (SYRINGE) ×3 IMPLANT
SYR TB 1ML LUER SLIP (SYRINGE) ×3 IMPLANT
WATER STERILE IRR 250ML POUR (IV SOLUTION) ×3 IMPLANT
WIPE NON LINTING 3.25X3.25 (MISCELLANEOUS) ×3 IMPLANT

## 2019-08-20 NOTE — Anesthesia Postprocedure Evaluation (Signed)
Anesthesia Post Note  Patient: Teresa Cunningham  Procedure(s) Performed: CATARACT EXTRACTION PHACO AND INTRAOCULAR LENS PLACEMENT (IOC)  ISTENT INJ RIGHT 00:25.2      8.4%       2.33 (Right Eye) INSERTION OF ANTERIOR SEGMENT AQUEOUS DRAINAGE DEVICE (ISTENT) (Right Eye)  Patient location during evaluation: PACU Anesthesia Type: MAC Level of consciousness: awake and alert Pain management: pain level controlled Vital Signs Assessment: post-procedure vital signs reviewed and stable Respiratory status: spontaneous breathing, nonlabored ventilation, respiratory function stable and patient connected to nasal cannula oxygen Cardiovascular status: stable and blood pressure returned to baseline Postop Assessment: no apparent nausea or vomiting Anesthetic complications: no    Meshilem Machuca A  Fabian Walder

## 2019-08-20 NOTE — Transfer of Care (Signed)
Immediate Anesthesia Transfer of Care Note  Patient: Teresa Cunningham  Procedure(s) Performed: CATARACT EXTRACTION PHACO AND INTRAOCULAR LENS PLACEMENT (IOC)  ISTENT INJ RIGHT 00:25.2      8.4%       2.33 (Right Eye) INSERTION OF ANTERIOR SEGMENT AQUEOUS DRAINAGE DEVICE (ISTENT) (Right Eye)  Patient Location: PACU  Anesthesia Type: MAC  Level of Consciousness: awake, alert  and patient cooperative  Airway and Oxygen Therapy: Patient Spontanous Breathing and Patient connected to supplemental oxygen  Post-op Assessment: Post-op Vital signs reviewed, Patient's Cardiovascular Status Stable, Respiratory Function Stable, Patent Airway and No signs of Nausea or vomiting  Post-op Vital Signs: Reviewed and stable  Complications: No apparent anesthesia complications

## 2019-08-20 NOTE — Op Note (Signed)
OPERATIVE NOTE  Teresa Cunningham [id number] 08/20/2019  PREOPERATIVE DIAGNOSIS:   1.  Mild open angle glaucoma, right eye. H40.1111  2.  Nuclear sclerotic cataract right eye.  H25.11   POSTOPERATIVE DIAGNOSIS:    same.   PROCEDURE:   1.  Placement of trabecular bypass stent (istent). CPT 0191T  and placement of additional stent  CPT 0376T 2.  Phacoemusification with posterior chamber intraocular lens placement of the right eye  CPT [phone]   LENS: Implant Name Type Inv. Item Serial No. Manufacturer Lot No. LRB No. Used Action  LENS IOL DIOP 23.0 - [id number] Intraocular Lens LENS IOL DIOP 23.0 [id number] AMO  Right 1 Implanted  [place] ISTENT - [id number] Stent INJECT ISTENT [id number] [employer] [id number] Right 1 Implanted      Procedure(s): CATARACT EXTRACTION PHACO AND INTRAOCULAR LENS PLACEMENT (IOC)  ISTENT INJ RIGHT 00:25.2      8.4%       2.33 (Right) INSERTION OF ANTERIOR SEGMENT AQUEOUS DRAINAGE DEVICE (ISTENT) (Right)    SURGEON:  Benay Pillow, MD, MPH  ANESTHESIOLOGIST: Anesthesiologist: Heniser, Fredric Dine, MD CRNA: Jeannene Patella, CRNA; Cameron Ali, CRNA   ANESTHESIA:  MAC and intracameral preservative-free intracameral lidocaine 4%.  ESTIMATED BLOOD LOSS: less than 1 mL.   COMPLICATIONS:  None.   DESCRIPTION OF PROCEDURE:  The patient was identified in the holding room and transported to the operating room.  The patient was placed in the supine position under the operating microscope.  The right eye was prepped and draped in the usual sterile ophthalmic fashion.   A 1.0 millimeter clear-corneal paracentesis was made at the 10:30 position. 0.5 ml of preservative-free 1% lidocaine with epinephrine was injected into the anterior chamber.  The anterior chamber was filled with Healon 5 viscoelastic.  A 2.4 millimeter keratome was used to make a near-clear corneal incision at the 8:00 position.   Attention was turned to the istent.  The patients head was  turned to the left and the microscope was tilted to 035 degrees.  Ocular instruments/Glaukos OAL/H2 gonioprism was used with IPC05 (iclip) coupled with Healon 5 on the cornea was used to visualize the trabecular meshwork. The istent was opened and introduced into the eye.  The meshwork was engaged with the tip of the iStent injector and the stent was deployed into Schlemm's canal at 2:00.  The second stent was deployed at 4:00.  The stents were well seated and in good position.  Next, attention was turned to the phacoemulsification A curvilinear capsulorrhexis was made with a cystotome and capsulorrhexis forceps.  Balanced salt solution was used to hydrodissect and hydrodelineate the nucleus.   Phacoemulsification was then used in stop and chop fashion to remove the lens nucleus and epinucleus.  The remaining cortex was then removed using the irrigation and aspiration handpiece. Healon was then placed into the capsular bag to distend it for lens placement.  A lens was then injected into the capsular bag.  The remaining viscoelastic was aspirated.   Wounds were hydrated with balanced salt solution.  The anterior chamber was inflated to a physiologic pressure with balanced salt solution.   Intracameral vigamox 0.1 mL undiluted was injected into the eye and a drop placed onto the ocular surface.  No wound leaks were noted. The patient was taken to the recovery room in stable condition without complications of anesthesia or surgery   Benay Pillow 08/20/2019, 12:31 PM

## 2019-08-20 NOTE — H&P (Signed)

## 2019-08-21 ENCOUNTER — Encounter: Payer: Self-pay | Admitting: Ophthalmology

## 2019-08-21 DIAGNOSIS — Z23 Encounter for immunization: Secondary | ICD-10-CM | POA: Diagnosis not present

## 2019-09-06 DIAGNOSIS — R5383 Other fatigue: Secondary | ICD-10-CM | POA: Diagnosis not present

## 2019-09-06 DIAGNOSIS — E785 Hyperlipidemia, unspecified: Secondary | ICD-10-CM | POA: Diagnosis not present

## 2019-09-06 DIAGNOSIS — R7303 Prediabetes: Secondary | ICD-10-CM | POA: Diagnosis not present

## 2019-09-06 DIAGNOSIS — I10 Essential (primary) hypertension: Secondary | ICD-10-CM | POA: Diagnosis not present

## 2019-09-10 DIAGNOSIS — R5383 Other fatigue: Secondary | ICD-10-CM | POA: Diagnosis not present

## 2019-09-10 DIAGNOSIS — R7303 Prediabetes: Secondary | ICD-10-CM | POA: Diagnosis not present

## 2019-09-10 DIAGNOSIS — Z23 Encounter for immunization: Secondary | ICD-10-CM | POA: Diagnosis not present

## 2019-09-10 DIAGNOSIS — E559 Vitamin D deficiency, unspecified: Secondary | ICD-10-CM | POA: Diagnosis not present

## 2019-09-10 DIAGNOSIS — E785 Hyperlipidemia, unspecified: Secondary | ICD-10-CM | POA: Diagnosis not present

## 2019-09-10 DIAGNOSIS — I10 Essential (primary) hypertension: Secondary | ICD-10-CM | POA: Diagnosis not present

## 2019-09-10 DIAGNOSIS — J309 Allergic rhinitis, unspecified: Secondary | ICD-10-CM | POA: Diagnosis not present

## 2020-06-04 ENCOUNTER — Other Ambulatory Visit: Payer: Self-pay | Admitting: Nurse Practitioner

## 2020-06-04 DIAGNOSIS — Z1231 Encounter for screening mammogram for malignant neoplasm of breast: Secondary | ICD-10-CM

## 2020-07-14 ENCOUNTER — Ambulatory Visit
Admission: RE | Admit: 2020-07-14 | Discharge: 2020-07-14 | Disposition: A | Discharge status: Home / Self Care | Payer: Medicare HMO | Source: Ambulatory Visit | Attending: Nurse Practitioner | Admitting: Nurse Practitioner

## 2020-07-14 ENCOUNTER — Other Ambulatory Visit: Payer: Self-pay

## 2020-07-14 DIAGNOSIS — Z1231 Encounter for screening mammogram for malignant neoplasm of breast: Secondary | ICD-10-CM | POA: Insufficient documentation

## 2020-08-11 IMAGING — MG MM DIGITAL DIAGNOSTIC UNILAT*R* W/ TOMO W/ CAD
4 series · 4 of 12 positions shown · non-contrast
Comparison: Previous exam(s).

CLINICAL DATA: Patient presents for additional views of the right
breast as follow-up to recent screening exam to evaluate a possible
asymmetry.

EXAM:
DIGITAL DIAGNOSTIC UNILATERAL RIGHT MAMMOGRAM WITH CAD AND TOMO

[R CC synth-2D]
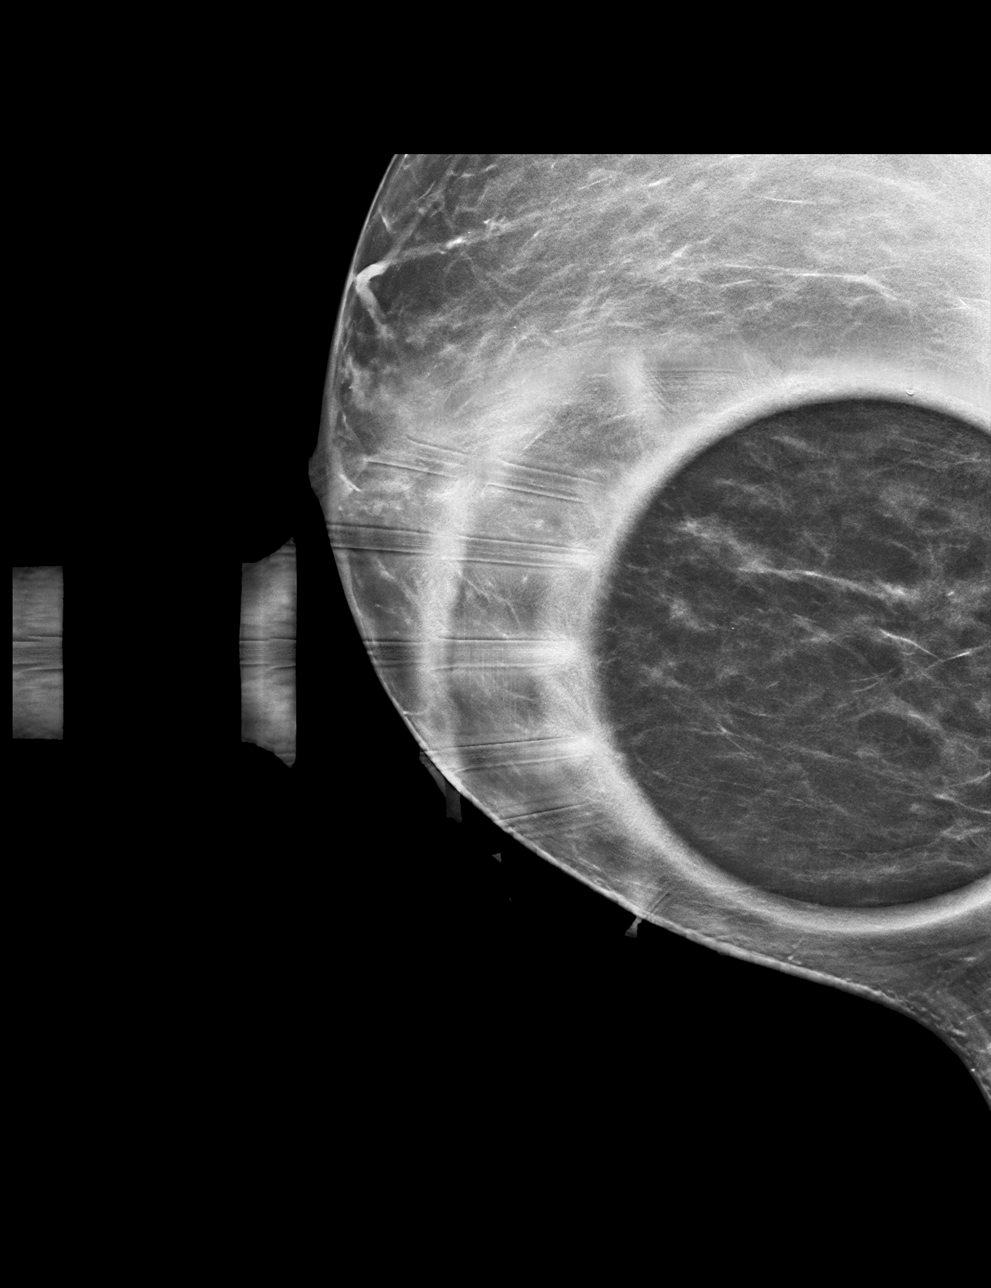

[R MLO synth-2D]
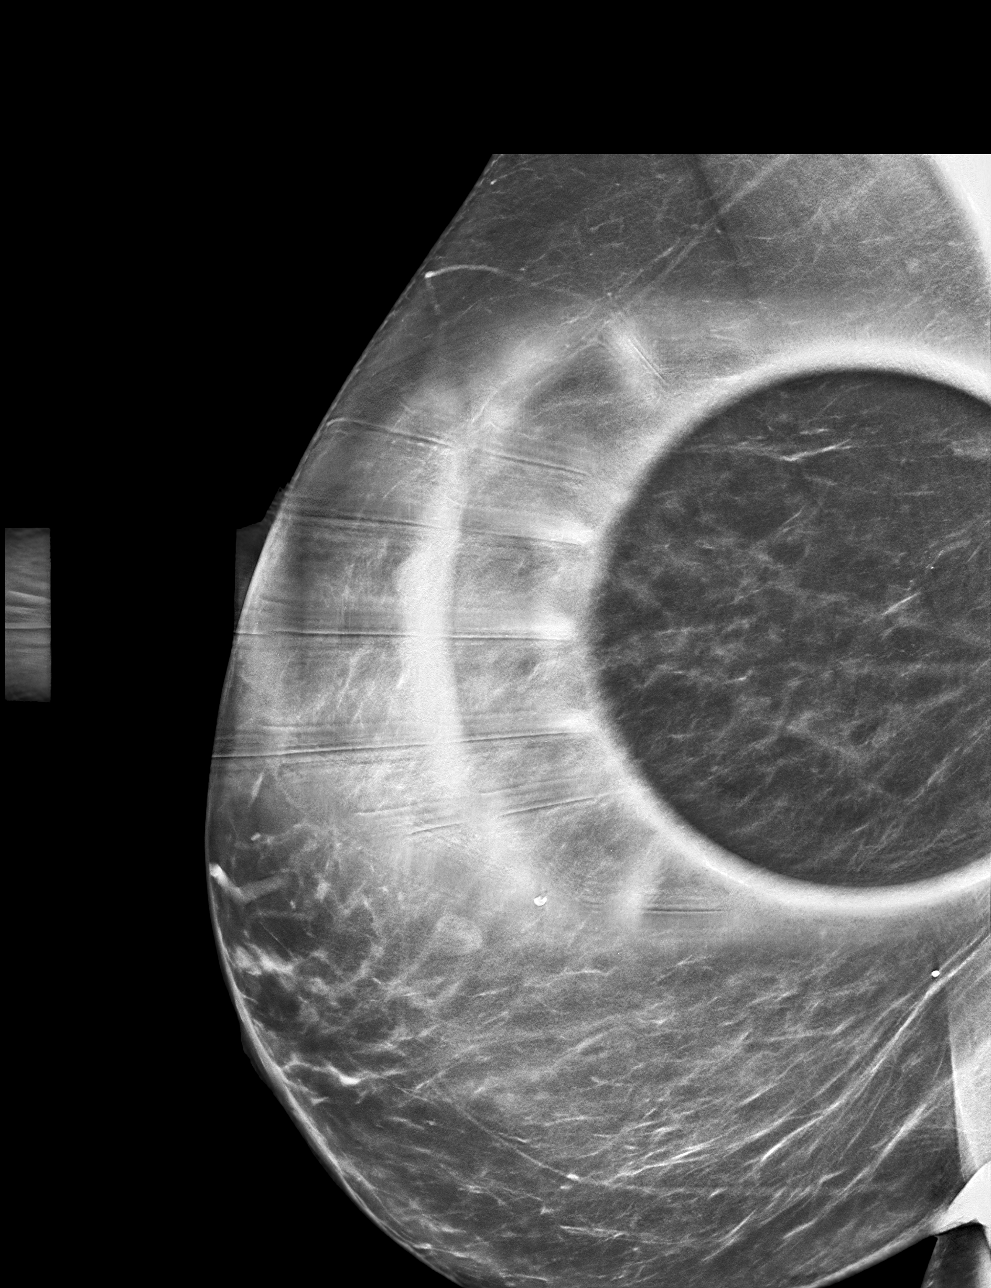

[R CC tomo · tomo slice 33/64.0]
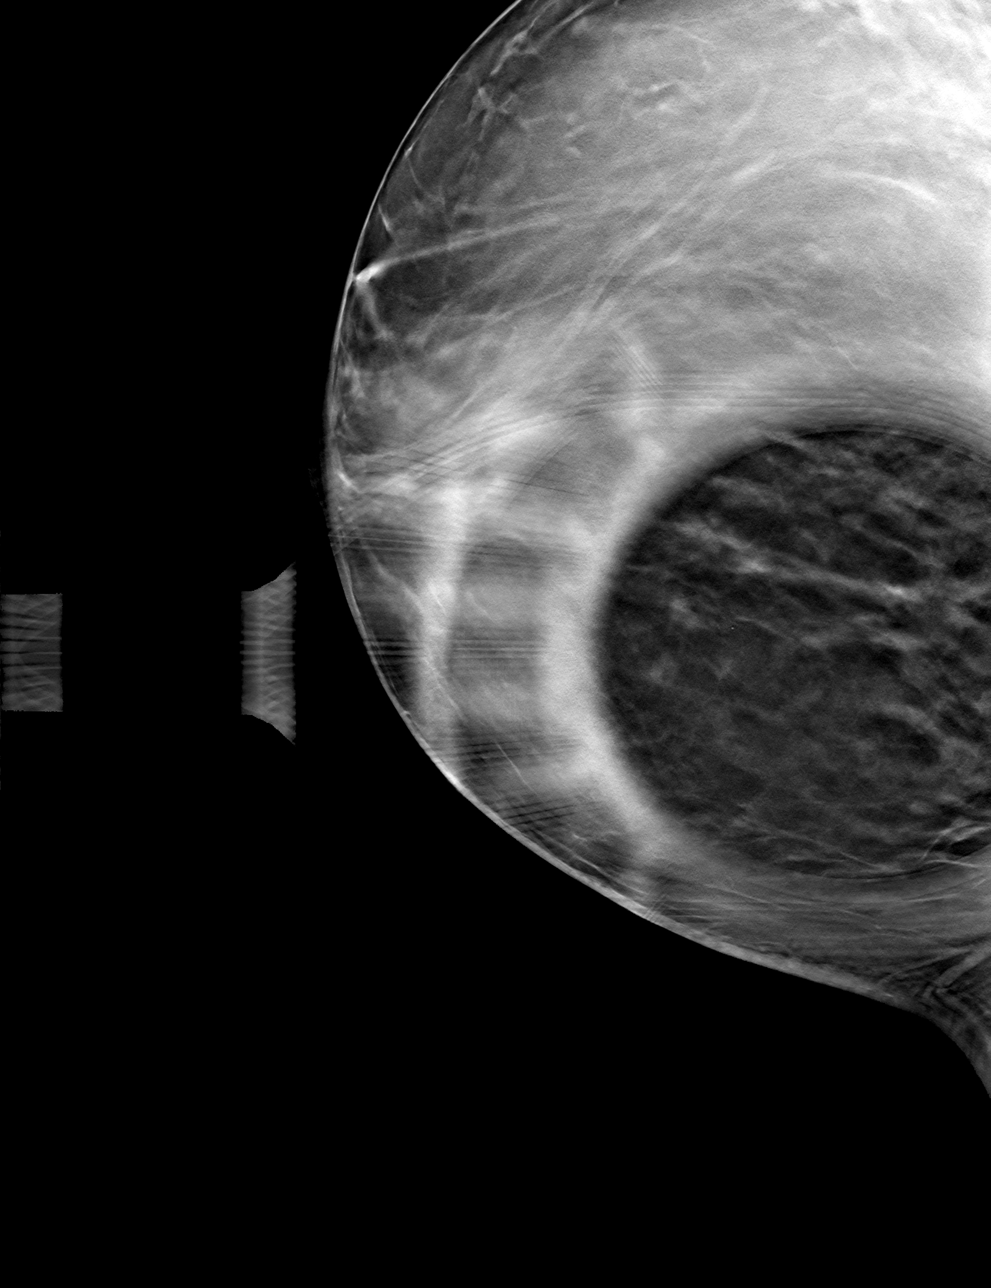

[R MLO tomo · tomo slice 37/72.0]
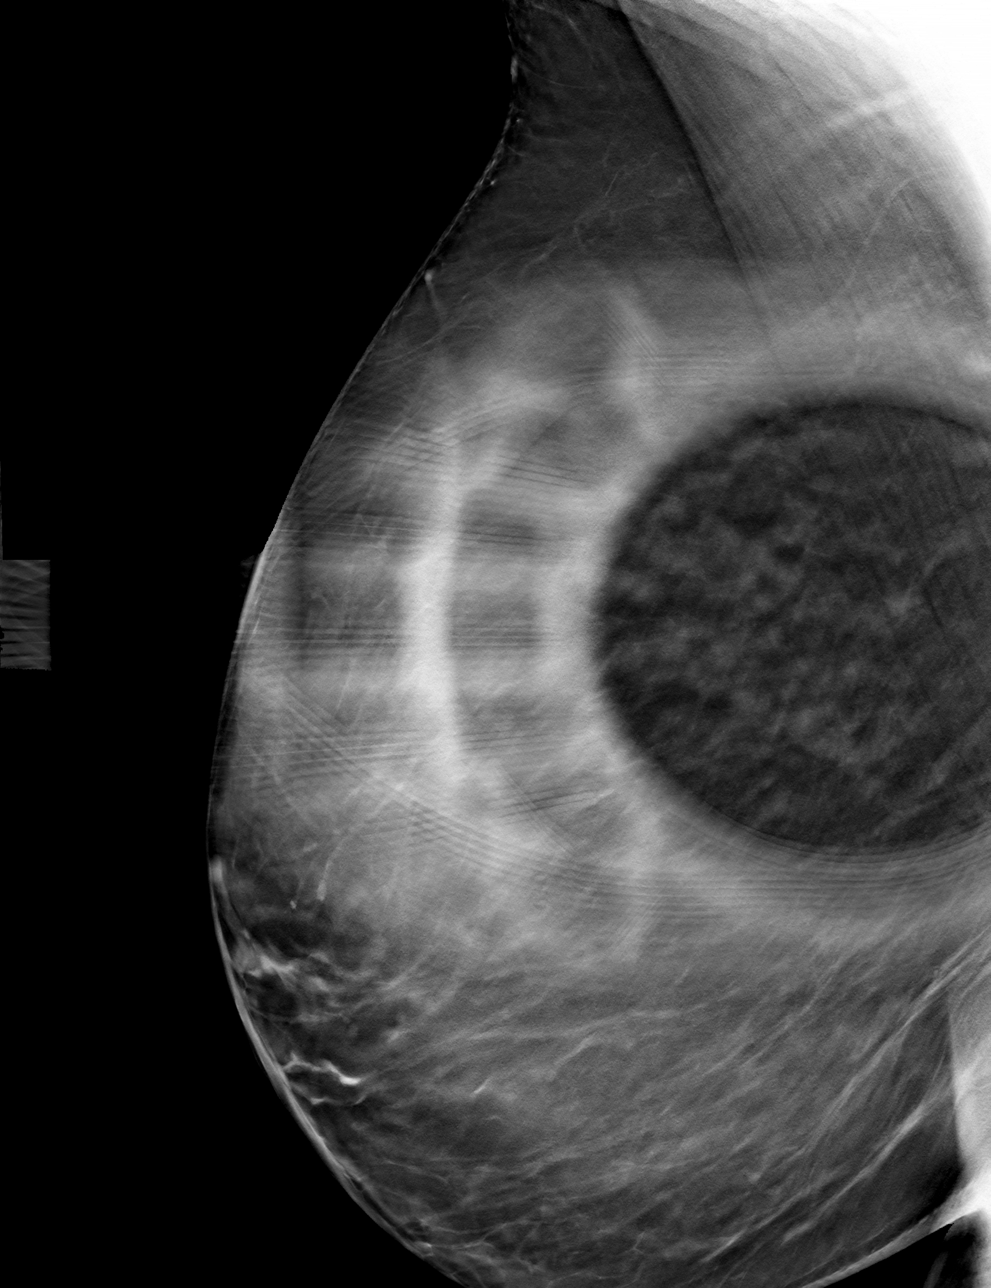

[4 of 12 positions shown; findings below may reference images not displayed]

ACR Breast Density Category b: There are scattered areas of
fibroglandular density.
FINDINGS: Spot compression images demonstrate asymmetric fibroglandular tissue
over the posterior third of the inner mid to upper right breast
without focal abnormality.

Mammographic images were processed with CAD.
IMPRESSION: No focal abnormality over the inner mid to upper right breast as
findings are compatible with asymmetric fibroglandular tissue.

RECOMMENDATION:
Recommend continued annual bilateral screening mammographic
follow-up.

I have discussed the findings and recommendations with the patient.
Results were also provided in writing at the conclusion of the
visit. If applicable, a reminder letter will be sent to the patient
regarding the next appointment.

BI-RADS CATEGORY  1: Negative.

## 2020-12-08 DIAGNOSIS — I10 Essential (primary) hypertension: Secondary | ICD-10-CM | POA: Diagnosis not present

## 2020-12-08 DIAGNOSIS — R7303 Prediabetes: Secondary | ICD-10-CM | POA: Diagnosis not present

## 2020-12-08 DIAGNOSIS — M7712 Lateral epicondylitis, left elbow: Secondary | ICD-10-CM | POA: Diagnosis not present

## 2020-12-08 DIAGNOSIS — J309 Allergic rhinitis, unspecified: Secondary | ICD-10-CM | POA: Diagnosis not present

## 2020-12-19 DIAGNOSIS — H401131 Primary open-angle glaucoma, bilateral, mild stage: Secondary | ICD-10-CM | POA: Diagnosis not present

## 2021-02-05 DIAGNOSIS — I10 Essential (primary) hypertension: Secondary | ICD-10-CM | POA: Diagnosis not present

## 2021-02-05 DIAGNOSIS — R5383 Other fatigue: Secondary | ICD-10-CM | POA: Diagnosis not present

## 2021-02-05 DIAGNOSIS — E559 Vitamin D deficiency, unspecified: Secondary | ICD-10-CM | POA: Diagnosis not present

## 2021-02-05 DIAGNOSIS — E785 Hyperlipidemia, unspecified: Secondary | ICD-10-CM | POA: Diagnosis not present

## 2021-02-05 DIAGNOSIS — R7303 Prediabetes: Secondary | ICD-10-CM | POA: Diagnosis not present

## 2021-02-06 DIAGNOSIS — M5412 Radiculopathy, cervical region: Secondary | ICD-10-CM | POA: Diagnosis not present

## 2021-02-11 DIAGNOSIS — I10 Essential (primary) hypertension: Secondary | ICD-10-CM | POA: Diagnosis not present

## 2021-02-11 DIAGNOSIS — R7303 Prediabetes: Secondary | ICD-10-CM | POA: Diagnosis not present

## 2021-02-11 DIAGNOSIS — G4700 Insomnia, unspecified: Secondary | ICD-10-CM | POA: Diagnosis not present

## 2021-03-03 DIAGNOSIS — Z8249 Family history of ischemic heart disease and other diseases of the circulatory system: Secondary | ICD-10-CM | POA: Diagnosis not present

## 2021-03-03 DIAGNOSIS — E785 Hyperlipidemia, unspecified: Secondary | ICD-10-CM | POA: Diagnosis not present

## 2021-03-03 DIAGNOSIS — I10 Essential (primary) hypertension: Secondary | ICD-10-CM | POA: Diagnosis not present

## 2021-03-03 DIAGNOSIS — K219 Gastro-esophageal reflux disease without esophagitis: Secondary | ICD-10-CM | POA: Diagnosis not present

## 2021-03-03 DIAGNOSIS — G8929 Other chronic pain: Secondary | ICD-10-CM | POA: Diagnosis not present

## 2021-03-03 DIAGNOSIS — M1990 Unspecified osteoarthritis, unspecified site: Secondary | ICD-10-CM | POA: Diagnosis not present

## 2021-03-03 DIAGNOSIS — H409 Unspecified glaucoma: Secondary | ICD-10-CM | POA: Diagnosis not present

## 2021-03-03 DIAGNOSIS — R609 Edema, unspecified: Secondary | ICD-10-CM | POA: Diagnosis not present

## 2021-03-03 DIAGNOSIS — Z6828 Body mass index (BMI) 28.0-28.9, adult: Secondary | ICD-10-CM | POA: Diagnosis not present

## 2021-03-03 DIAGNOSIS — E663 Overweight: Secondary | ICD-10-CM | POA: Diagnosis not present

## 2021-05-04 DIAGNOSIS — M79644 Pain in right finger(s): Secondary | ICD-10-CM | POA: Diagnosis not present

## 2021-05-04 DIAGNOSIS — M7542 Impingement syndrome of left shoulder: Secondary | ICD-10-CM | POA: Diagnosis not present

## 2021-05-04 DIAGNOSIS — M5412 Radiculopathy, cervical region: Secondary | ICD-10-CM | POA: Diagnosis not present

## 2021-06-15 ENCOUNTER — Other Ambulatory Visit: Payer: Self-pay | Admitting: Nurse Practitioner

## 2021-06-15 DIAGNOSIS — Z1231 Encounter for screening mammogram for malignant neoplasm of breast: Secondary | ICD-10-CM

## 2021-06-19 DIAGNOSIS — H524 Presbyopia: Secondary | ICD-10-CM | POA: Diagnosis not present

## 2021-06-19 DIAGNOSIS — H401131 Primary open-angle glaucoma, bilateral, mild stage: Secondary | ICD-10-CM | POA: Diagnosis not present

## 2021-07-15 ENCOUNTER — Ambulatory Visit
Admission: RE | Admit: 2021-07-15 | Discharge: 2021-07-15 | Disposition: A | Discharge status: Home / Self Care | Payer: Medicare HMO | Source: Ambulatory Visit | Attending: Nurse Practitioner | Admitting: Nurse Practitioner

## 2021-07-15 ENCOUNTER — Other Ambulatory Visit: Payer: Self-pay

## 2021-07-15 DIAGNOSIS — Z1231 Encounter for screening mammogram for malignant neoplasm of breast: Secondary | ICD-10-CM | POA: Diagnosis not present

## 2021-08-05 IMAGING — MG DIGITAL SCREENING BILATERAL MAMMOGRAM WITH TOMO AND CAD
8 series · 8 of 24 positions shown · non-contrast
Comparison: Previous exam(s).

CLINICAL DATA: Screening.

EXAM:
DIGITAL SCREENING BILATERAL MAMMOGRAM WITH TOMO AND CAD

[R CC synth-2D]
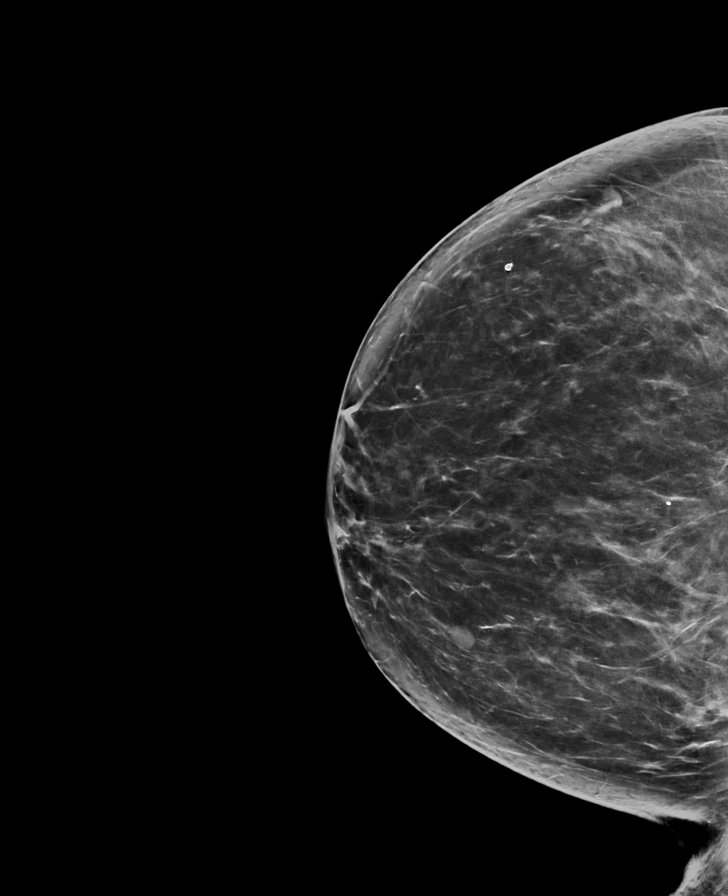

[L CC synth-2D]
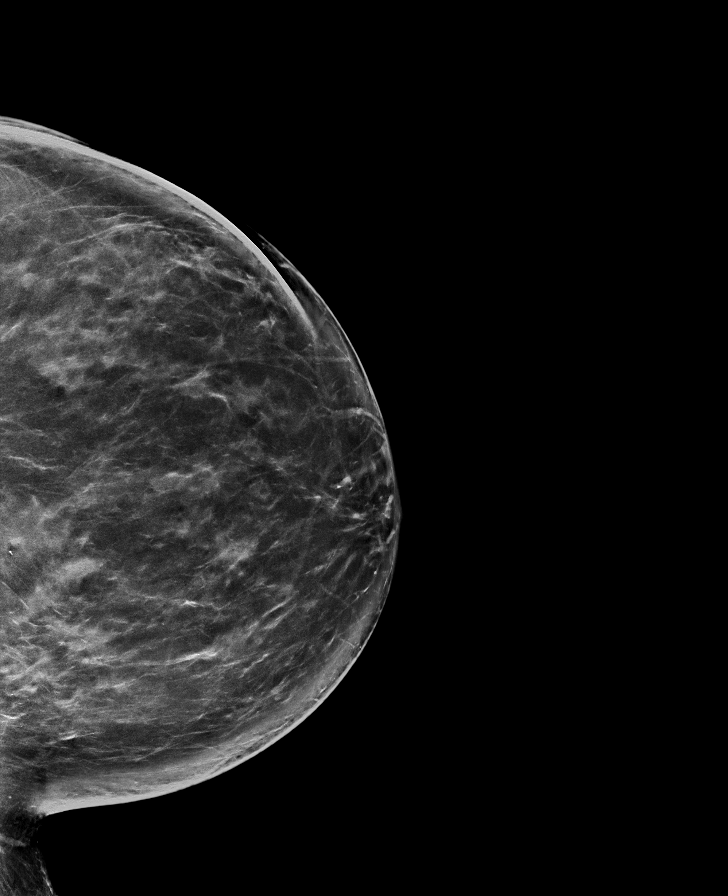

[L MLO synth-2D]
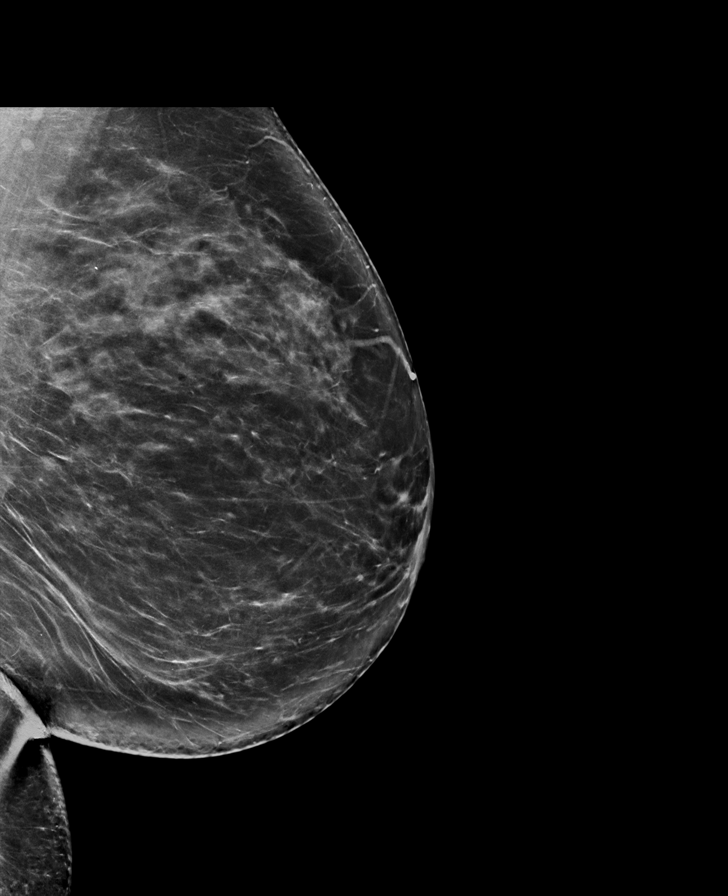

[R MLO synth-2D]
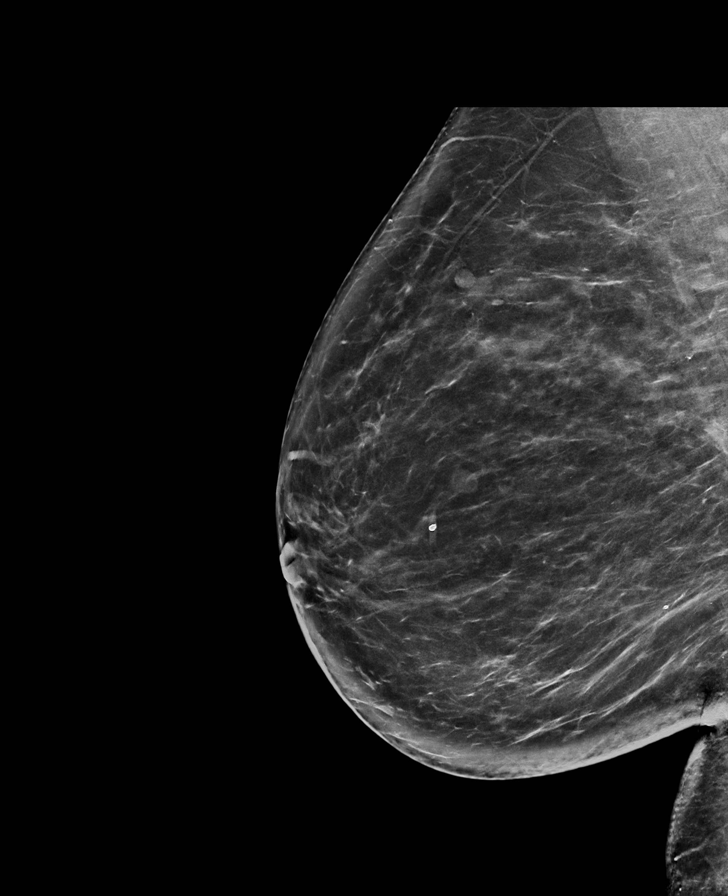

[R CC tomo · tomo slice 43/85.0]
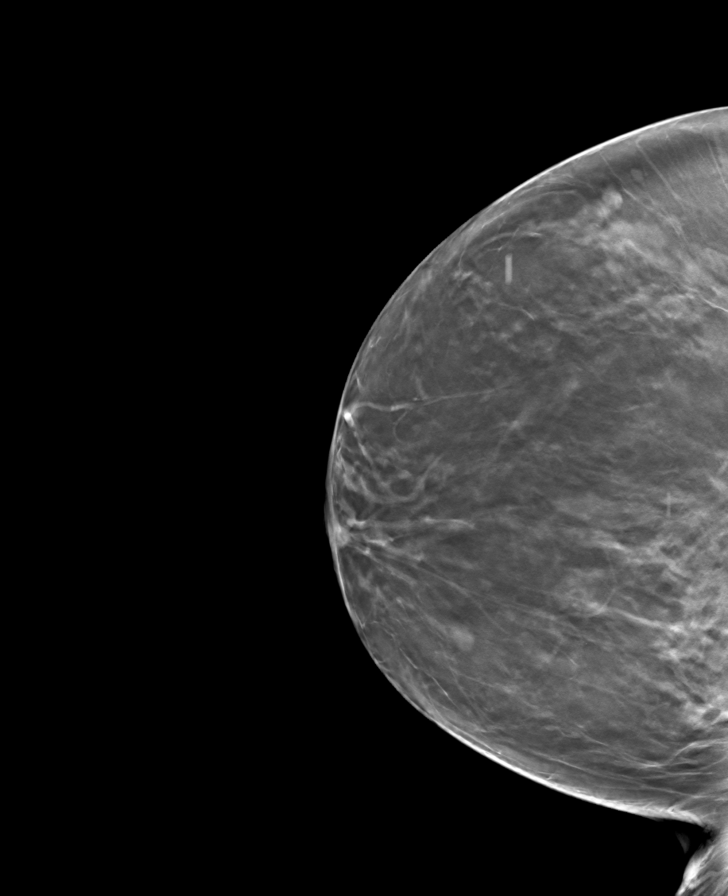

[L MLO tomo · tomo slice 47/92.0]
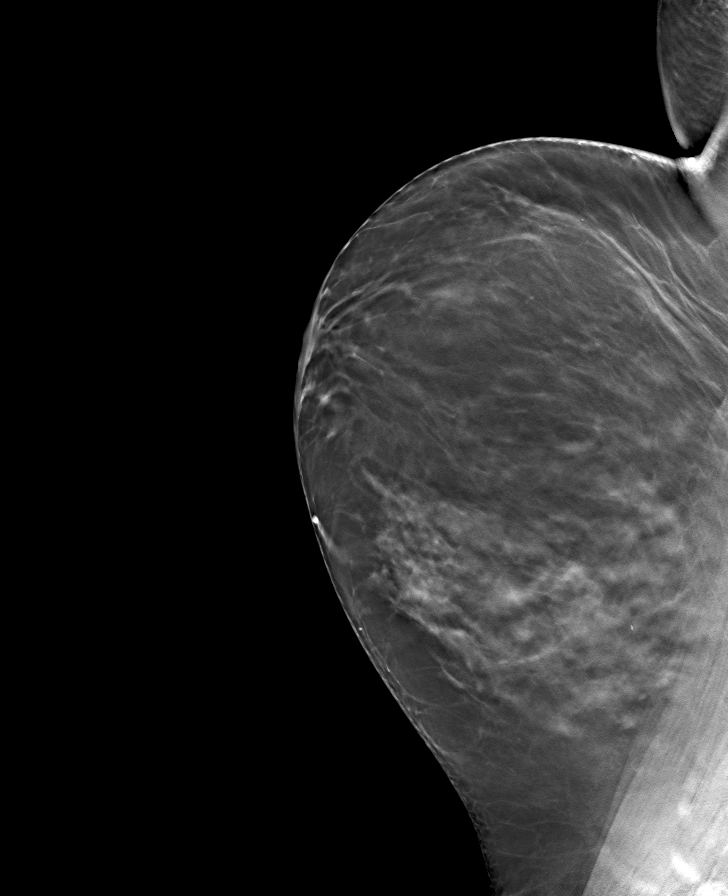

[R MLO tomo · tomo slice 43/86.0]
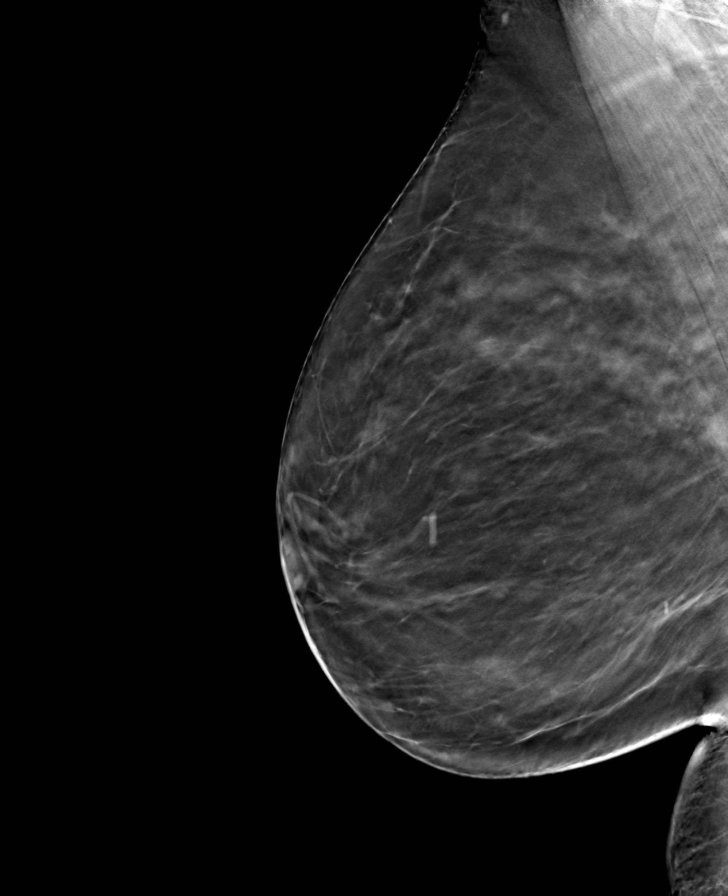

[L CC tomo · tomo slice 46/91.0]
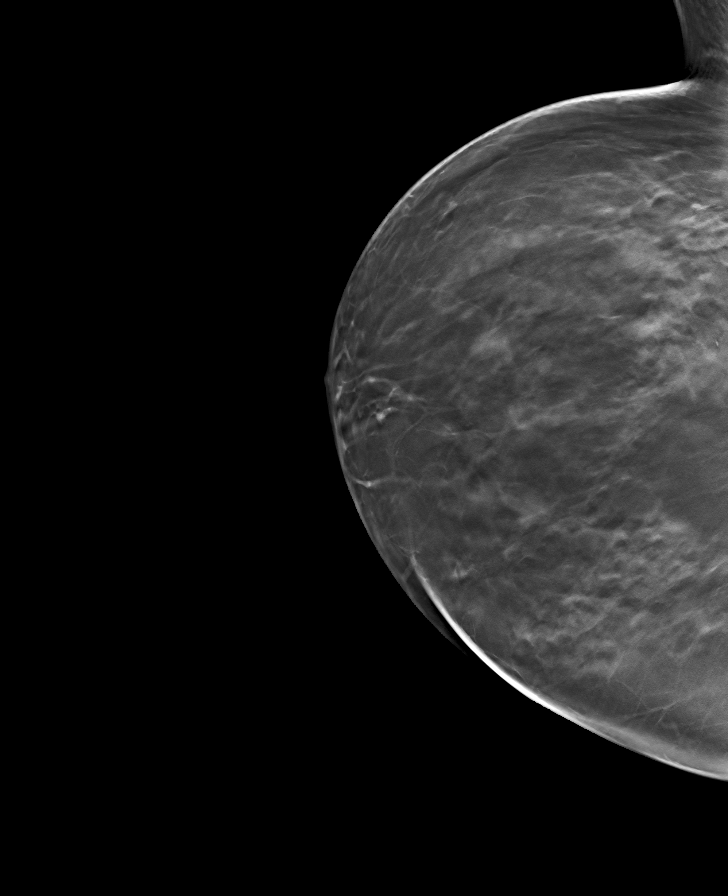

[8 of 24 positions shown; findings below may reference images not displayed]

ACR Breast Density Category c: The breast tissue is heterogeneously
dense, which may obscure small masses.
FINDINGS: There are no findings suspicious for malignancy. Images were
processed with CAD.
IMPRESSION: No mammographic evidence of malignancy. A result letter of this
screening mammogram will be mailed directly to the patient.

RECOMMENDATION:
Screening mammogram in one year. (Code:[id number])

BI-RADS CATEGORY  1: Negative.

## 2021-08-11 DIAGNOSIS — E785 Hyperlipidemia, unspecified: Secondary | ICD-10-CM | POA: Diagnosis not present

## 2021-08-11 DIAGNOSIS — E669 Obesity, unspecified: Secondary | ICD-10-CM | POA: Diagnosis not present

## 2021-08-11 DIAGNOSIS — R7303 Prediabetes: Secondary | ICD-10-CM | POA: Diagnosis not present

## 2021-08-11 DIAGNOSIS — I10 Essential (primary) hypertension: Secondary | ICD-10-CM | POA: Diagnosis not present

## 2021-08-25 DIAGNOSIS — M25512 Pain in left shoulder: Secondary | ICD-10-CM | POA: Diagnosis not present

## 2021-08-25 DIAGNOSIS — Z23 Encounter for immunization: Secondary | ICD-10-CM | POA: Diagnosis not present

## 2021-08-25 DIAGNOSIS — R7303 Prediabetes: Secondary | ICD-10-CM | POA: Diagnosis not present

## 2021-08-25 DIAGNOSIS — I10 Essential (primary) hypertension: Secondary | ICD-10-CM | POA: Diagnosis not present

## 2021-08-25 DIAGNOSIS — M65311 Trigger thumb, right thumb: Secondary | ICD-10-CM | POA: Diagnosis not present

## 2021-08-25 DIAGNOSIS — E785 Hyperlipidemia, unspecified: Secondary | ICD-10-CM | POA: Diagnosis not present

## 2021-08-31 DIAGNOSIS — M19049 Primary osteoarthritis, unspecified hand: Secondary | ICD-10-CM | POA: Diagnosis not present

## 2021-08-31 DIAGNOSIS — M542 Cervicalgia: Secondary | ICD-10-CM | POA: Diagnosis not present

## 2021-08-31 DIAGNOSIS — M7542 Impingement syndrome of left shoulder: Secondary | ICD-10-CM | POA: Diagnosis not present

## 2021-09-17 DIAGNOSIS — M25512 Pain in left shoulder: Secondary | ICD-10-CM | POA: Diagnosis not present

## 2021-10-12 DIAGNOSIS — M7542 Impingement syndrome of left shoulder: Secondary | ICD-10-CM | POA: Diagnosis not present

## 2021-10-12 DIAGNOSIS — M25512 Pain in left shoulder: Secondary | ICD-10-CM | POA: Diagnosis not present

## 2021-11-03 DIAGNOSIS — M7542 Impingement syndrome of left shoulder: Secondary | ICD-10-CM | POA: Diagnosis not present

## 2022-01-20 DIAGNOSIS — H409 Unspecified glaucoma: Secondary | ICD-10-CM | POA: Diagnosis not present

## 2022-01-20 DIAGNOSIS — I10 Essential (primary) hypertension: Secondary | ICD-10-CM | POA: Diagnosis not present

## 2022-01-20 DIAGNOSIS — E663 Overweight: Secondary | ICD-10-CM | POA: Diagnosis not present

## 2022-01-20 DIAGNOSIS — E785 Hyperlipidemia, unspecified: Secondary | ICD-10-CM | POA: Diagnosis not present

## 2022-01-20 DIAGNOSIS — Z8249 Family history of ischemic heart disease and other diseases of the circulatory system: Secondary | ICD-10-CM | POA: Diagnosis not present

## 2022-01-20 DIAGNOSIS — Z833 Family history of diabetes mellitus: Secondary | ICD-10-CM | POA: Diagnosis not present

## 2022-01-20 DIAGNOSIS — M1990 Unspecified osteoarthritis, unspecified site: Secondary | ICD-10-CM | POA: Diagnosis not present

## 2022-01-20 DIAGNOSIS — K219 Gastro-esophageal reflux disease without esophagitis: Secondary | ICD-10-CM | POA: Diagnosis not present

## 2022-01-20 DIAGNOSIS — Z008 Encounter for other general examination: Secondary | ICD-10-CM | POA: Diagnosis not present

## 2022-02-16 DIAGNOSIS — I10 Essential (primary) hypertension: Secondary | ICD-10-CM | POA: Diagnosis not present

## 2022-02-16 DIAGNOSIS — R7303 Prediabetes: Secondary | ICD-10-CM | POA: Diagnosis not present

## 2022-02-16 DIAGNOSIS — E785 Hyperlipidemia, unspecified: Secondary | ICD-10-CM | POA: Diagnosis not present

## 2022-02-22 DIAGNOSIS — M810 Age-related osteoporosis without current pathological fracture: Secondary | ICD-10-CM | POA: Diagnosis not present

## 2022-02-22 DIAGNOSIS — R7303 Prediabetes: Secondary | ICD-10-CM | POA: Diagnosis not present

## 2022-02-22 DIAGNOSIS — E785 Hyperlipidemia, unspecified: Secondary | ICD-10-CM | POA: Diagnosis not present

## 2022-02-22 DIAGNOSIS — I10 Essential (primary) hypertension: Secondary | ICD-10-CM | POA: Diagnosis not present

## 2022-02-22 DIAGNOSIS — Z0001 Encounter for general adult medical examination with abnormal findings: Secondary | ICD-10-CM | POA: Diagnosis not present

## 2022-02-23 DIAGNOSIS — M25512 Pain in left shoulder: Secondary | ICD-10-CM | POA: Diagnosis not present

## 2022-06-08 DIAGNOSIS — N958 Other specified menopausal and perimenopausal disorders: Secondary | ICD-10-CM | POA: Diagnosis not present

## 2022-06-18 ENCOUNTER — Other Ambulatory Visit: Payer: Self-pay | Admitting: Nurse Practitioner

## 2022-06-18 DIAGNOSIS — Z1231 Encounter for screening mammogram for malignant neoplasm of breast: Secondary | ICD-10-CM

## 2022-06-29 DIAGNOSIS — H401131 Primary open-angle glaucoma, bilateral, mild stage: Secondary | ICD-10-CM | POA: Diagnosis not present

## 2022-07-16 ENCOUNTER — Ambulatory Visit
Admission: RE | Admit: 2022-07-16 | Discharge: 2022-07-16 | Disposition: A | Discharge status: Home / Self Care | Payer: Medicare HMO | Source: Ambulatory Visit | Attending: Nurse Practitioner | Admitting: Nurse Practitioner

## 2022-07-16 DIAGNOSIS — Z1231 Encounter for screening mammogram for malignant neoplasm of breast: Secondary | ICD-10-CM | POA: Diagnosis not present

## 2022-07-28 DIAGNOSIS — Z0000 Encounter for general adult medical examination without abnormal findings: Secondary | ICD-10-CM | POA: Diagnosis not present

## 2022-08-18 DIAGNOSIS — Z20822 Contact with and (suspected) exposure to covid-19: Secondary | ICD-10-CM | POA: Diagnosis not present

## 2022-08-18 DIAGNOSIS — Z20811 Contact with and (suspected) exposure to meningococcus: Secondary | ICD-10-CM | POA: Diagnosis not present

## 2022-08-18 DIAGNOSIS — Z03818 Encounter for observation for suspected exposure to other biological agents ruled out: Secondary | ICD-10-CM | POA: Diagnosis not present

## 2022-08-19 DIAGNOSIS — J069 Acute upper respiratory infection, unspecified: Secondary | ICD-10-CM | POA: Diagnosis not present

## 2022-08-23 DIAGNOSIS — I10 Essential (primary) hypertension: Secondary | ICD-10-CM | POA: Diagnosis not present

## 2022-08-23 DIAGNOSIS — E785 Hyperlipidemia, unspecified: Secondary | ICD-10-CM | POA: Diagnosis not present

## 2022-08-23 DIAGNOSIS — R7303 Prediabetes: Secondary | ICD-10-CM | POA: Diagnosis not present

## 2022-08-26 DIAGNOSIS — I10 Essential (primary) hypertension: Secondary | ICD-10-CM | POA: Diagnosis not present

## 2022-08-26 DIAGNOSIS — R5383 Other fatigue: Secondary | ICD-10-CM | POA: Diagnosis not present

## 2022-08-26 DIAGNOSIS — E876 Hypokalemia: Secondary | ICD-10-CM | POA: Diagnosis not present

## 2022-08-26 DIAGNOSIS — E785 Hyperlipidemia, unspecified: Secondary | ICD-10-CM | POA: Diagnosis not present

## 2022-08-26 DIAGNOSIS — R7303 Prediabetes: Secondary | ICD-10-CM | POA: Diagnosis not present

## 2022-09-07 DIAGNOSIS — E876 Hypokalemia: Secondary | ICD-10-CM | POA: Diagnosis not present

## 2022-09-29 DIAGNOSIS — M7542 Impingement syndrome of left shoulder: Secondary | ICD-10-CM | POA: Diagnosis not present

## 2022-10-01 DIAGNOSIS — Z1272 Encounter for screening for malignant neoplasm of vagina: Secondary | ICD-10-CM | POA: Diagnosis not present

## 2022-10-01 DIAGNOSIS — R7303 Prediabetes: Secondary | ICD-10-CM | POA: Diagnosis not present

## 2022-10-01 DIAGNOSIS — R87619 Unspecified abnormal cytological findings in specimens from cervix uteri: Secondary | ICD-10-CM | POA: Diagnosis not present

## 2022-10-01 DIAGNOSIS — Z202 Contact with and (suspected) exposure to infections with a predominantly sexual mode of transmission: Secondary | ICD-10-CM | POA: Diagnosis not present

## 2022-10-01 DIAGNOSIS — N760 Acute vaginitis: Secondary | ICD-10-CM | POA: Diagnosis not present

## 2022-10-01 DIAGNOSIS — I10 Essential (primary) hypertension: Secondary | ICD-10-CM | POA: Diagnosis not present

## 2022-10-01 DIAGNOSIS — R69 Illness, unspecified: Secondary | ICD-10-CM | POA: Diagnosis not present

## 2022-10-01 DIAGNOSIS — Z23 Encounter for immunization: Secondary | ICD-10-CM | POA: Diagnosis not present

## 2022-10-01 DIAGNOSIS — E785 Hyperlipidemia, unspecified: Secondary | ICD-10-CM | POA: Diagnosis not present

## 2022-10-28 ENCOUNTER — Ambulatory Visit: Payer: Medicare HMO

## 2022-11-08 ENCOUNTER — Ambulatory Visit (LOCAL_COMMUNITY_HEALTH_CENTER): Payer: Medicare HMO

## 2022-11-08 DIAGNOSIS — E785 Hyperlipidemia, unspecified: Secondary | ICD-10-CM | POA: Diagnosis not present

## 2022-11-08 DIAGNOSIS — R635 Abnormal weight gain: Secondary | ICD-10-CM | POA: Diagnosis not present

## 2022-11-08 DIAGNOSIS — I10 Essential (primary) hypertension: Secondary | ICD-10-CM | POA: Diagnosis not present

## 2022-11-08 DIAGNOSIS — Z719 Counseling, unspecified: Secondary | ICD-10-CM

## 2022-11-08 DIAGNOSIS — L02214 Cutaneous abscess of groin: Secondary | ICD-10-CM | POA: Diagnosis not present

## 2022-11-08 DIAGNOSIS — Z23 Encounter for immunization: Secondary | ICD-10-CM

## 2022-11-08 NOTE — Progress Notes (Signed)
Client seen in nurse clinic for COVID-19 vaccine VIS provided Screening Questions: Are you feeling sick today? No   Have you ever received a dose of COVID-19 Vaccine? [employer], [place], [place], [place], Other) Yes  If yes, which vaccine and how many doses?   [place] 4   Did you bring the vaccination record card or other documentation?  No   Do you have a health condition or are undergoing treatment that makes you moderately or severely immunocompromised? This would include, but not be limited to: cancer, HIV, organ transplant, immunosuppressive therapy/high-dose corticosteroids, or moderate/severe primary immunodeficiency.  NO  Have you received COVID-19 vaccine before or during hematopoietic cell transplant (HCT) or CAR-T-cell therapies? No  Have you ever had an allergic reaction to: (This would include a severe allergic reaction or a reaction that caused hives, swelling, or respiratory distress, including wheezing.) A component of a COVID-19 vaccine or a previous dose of COVID-19 vaccine? No   Have you ever had an allergic reaction to another vaccine (other thanCOVID-19 vaccine) or an injectable medication? (This would include a severe allergic reaction or a reaction that caused hives, swelling, or respiratory distress, including wheezing.)   No    Do you have a history of any of the following:  Myocarditis or Pericarditis No  Dermal fillers:  No  Multisystem Inflammatory Syndrome (MIS-C or MIS-A)? No  COVID-19 disease within the past 3 months? No  Vaccinated with monkeypox vaccine in the last 4 weeks? No  Vaccine administered and tolerated well.  After vaccine care reviewed. Copy of [facility] provided.  Kaelah Hayashi Shelda Pal, RN

## 2022-12-23 DIAGNOSIS — R5383 Other fatigue: Secondary | ICD-10-CM | POA: Diagnosis not present

## 2022-12-23 DIAGNOSIS — I10 Essential (primary) hypertension: Secondary | ICD-10-CM | POA: Diagnosis not present

## 2022-12-23 DIAGNOSIS — E785 Hyperlipidemia, unspecified: Secondary | ICD-10-CM | POA: Diagnosis not present

## 2022-12-23 DIAGNOSIS — R7303 Prediabetes: Secondary | ICD-10-CM | POA: Diagnosis not present

## 2023-01-11 ENCOUNTER — Encounter: Payer: Self-pay | Admitting: Obstetrics & Gynecology

## 2023-01-11 ENCOUNTER — Ambulatory Visit: Payer: Medicare HMO | Admitting: Obstetrics & Gynecology

## 2023-01-11 VITALS — BP 120/80 | Ht 62.0 in | Wt 138.0 lb

## 2023-01-11 DIAGNOSIS — N898 Other specified noninflammatory disorders of vagina: Secondary | ICD-10-CM

## 2023-01-11 DIAGNOSIS — Z8541 Personal history of malignant neoplasm of cervix uteri: Secondary | ICD-10-CM | POA: Diagnosis not present

## 2023-01-11 DIAGNOSIS — Z0000 Encounter for general adult medical examination without abnormal findings: Secondary | ICD-10-CM

## 2023-01-11 NOTE — Progress Notes (Signed)
   Established Patient Office Visit  Subjective   Patient ID: Teresa Cunningham, female    DOB: 1951/08/26  Age: 72 y.o. MRN: [id number]  Chief Complaint  Patient presents with   Bartholin's Cyst    HPI    72 yo married P1 (70 yo daughter, 3 grands) here as a new patient. She comes in with a h/o recurrent small bumps/cysts of her right vulva. These happen every 2-4 months, not painful. They always resolve without treatment. She currently doesn't have one.This started a couple of years ago.  She occasionally has a vaginal odor but that is not present currently.  ROS- mammogram UTD She reports a h/o cervical cancer in 1986 treated with a hysterectomy, no chemo or RT. She has been married for 50 years. 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  Objective:     BP 120/80   Ht '5\' 2"'$  (1.575 m)   Wt 138 lb (62.6 kg)   BMI 25.24 kg/m    Physical Exam   Well nourished, well hydrated Black female, no apparent distress Moderate VVA No odor or cysts Spec exam reveals an atrophic vaginal epithelium and cuff, no lesions or discharge Bimanual exam reveals no masses   Assessment & Plan:  Vaginal odor- I suspect BV and have suggested boric acid supps prn I told her that I am happy to do testing when she has the odor.  Recurrent right labial cyst- I suspect a Bartholin's but will need to see it when it is present. Problem List Items Addressed This Visit   None      Emily Filbert, MD

## 2023-01-13 ENCOUNTER — Telehealth: Payer: Self-pay

## 2023-01-13 NOTE — Telephone Encounter (Signed)
Pt called needing a RX for boric acid. I advised pt that Dr Hulan Fray did not send in a rx she can buy this OTC. Pt aware and understands .

## 2023-01-17 ENCOUNTER — Telehealth: Payer: Self-pay

## 2023-01-17 NOTE — Telephone Encounter (Signed)
Patient called stating she needs to know when she's supposed to come back and see chelsa and needs to schedule appt

## 2023-01-29 ENCOUNTER — Other Ambulatory Visit: Payer: Self-pay | Admitting: Nurse Practitioner

## 2023-03-07 ENCOUNTER — Other Ambulatory Visit: Payer: Self-pay | Admitting: Nurse Practitioner

## 2023-03-07 ENCOUNTER — Telehealth: Payer: Self-pay

## 2023-03-07 DIAGNOSIS — E782 Mixed hyperlipidemia: Secondary | ICD-10-CM

## 2023-03-07 DIAGNOSIS — R7301 Impaired fasting glucose: Secondary | ICD-10-CM

## 2023-03-07 NOTE — Telephone Encounter (Signed)
Pt called and left vm requesting a call back regarding if she needs to have labs done before appt on 5/2

## 2023-03-28 ENCOUNTER — Other Ambulatory Visit: Payer: Medicare HMO

## 2023-03-28 DIAGNOSIS — R7301 Impaired fasting glucose: Secondary | ICD-10-CM | POA: Diagnosis not present

## 2023-03-28 DIAGNOSIS — E782 Mixed hyperlipidemia: Secondary | ICD-10-CM | POA: Diagnosis not present

## 2023-03-29 LAB — CMP14+EGFR
ALT: 13 IU/L (ref 0–32)
AST: 23 IU/L (ref 0–40)
Albumin/Globulin Ratio: 1.3 (ref 1.2–2.2)
Albumin: 4 g/dL (ref 3.9–4.9)
Alkaline Phosphatase: 70 IU/L (ref 44–121)
BUN/Creatinine Ratio: 15 (ref 12–28)
BUN: 14 mg/dL (ref 8–27)
Bilirubin Total: 0.4 mg/dL (ref 0.0–1.2)
CO2: 25 mmol/L (ref 20–29)
Calcium: 9.6 mg/dL (ref 8.7–10.3)
Chloride: 100 mmol/L (ref 96–106)
Creatinine, Ser: 0.95 mg/dL (ref 0.57–1.00)
Globulin, Total: 3 g/dL (ref 1.5–4.5)
Glucose: 99 mg/dL (ref 70–99)
Potassium: 3.8 mmol/L (ref 3.5–5.2)
Sodium: 143 mmol/L (ref 134–144)
Total Protein: 7 g/dL (ref 6.0–8.5)
eGFR: 64 mL/min/{1.73_m2} (ref >59)

## 2023-03-29 LAB — LIPID PANEL
Chol/HDL Ratio: 2.5 ratio (ref 0.0–4.4)
Cholesterol, Total: 147 mg/dL (ref 100–199)
HDL: 60 mg/dL (ref >39)
LDL Chol Calc (NIH): 75 mg/dL (ref 0–99)
Triglycerides: 56 mg/dL (ref 0–149)
VLDL Cholesterol Cal: 12 mg/dL (ref 5–40)

## 2023-03-29 LAB — HEMOGLOBIN A1C
Est. average glucose Bld gHb Est-mCnc: 128 mg/dL
Hgb A1c MFr Bld: 6.1 % — ABNORMAL HIGH (ref 4.8–5.6)

## 2023-03-29 LAB — TSH: TSH: 1.37 u[IU]/mL (ref 0.450–4.500)

## 2023-03-31 ENCOUNTER — Ambulatory Visit: Payer: Medicare HMO | Admitting: Nurse Practitioner

## 2023-03-31 ENCOUNTER — Encounter: Payer: Self-pay | Admitting: Nurse Practitioner

## 2023-03-31 VITALS — BP 120/70 | HR 70 | Ht 62.0 in | Wt 137.6 lb

## 2023-03-31 DIAGNOSIS — R7303 Prediabetes: Secondary | ICD-10-CM | POA: Diagnosis not present

## 2023-03-31 DIAGNOSIS — I10 Essential (primary) hypertension: Secondary | ICD-10-CM | POA: Diagnosis not present

## 2023-03-31 DIAGNOSIS — E782 Mixed hyperlipidemia: Secondary | ICD-10-CM | POA: Insufficient documentation

## 2023-03-31 NOTE — Progress Notes (Signed)
Established Patient Office Visit  Subjective:  Patient ID: Teresa Cunningham, female    DOB: 12-19-1950  Age: 72 y.o. MRN: [id number]  Chief Complaint  Patient presents with   Follow-up    3 month lab results    Follow up and review of recent fasting labs.    No other concerns at this time.   Past Medical History:  Diagnosis Date   Cervical cancer (HCC)    DVT (deep venous thrombosis) (HCC)    H/O DVT right leg   GERD (gastroesophageal reflux disease)    Hypercholesterolemia    Hypertension    controlled on meds   Swelling of lower extremity    Vertigo    Wears partial dentures    bottom    Past Surgical History:  Procedure Laterality Date   ABDOMINAL HYSTERECTOMY     BREAST EXCISIONAL BIOPSY Left 1996   neg   BREAST EXCISIONAL BIOPSY Left    neg   CATARACT EXTRACTION W/PHACO Left 10/11/2017   Procedure: CATARACT EXTRACTION PHACO AND INTRAOCULAR LENS PLACEMENT (IOC) LEFT ISTENT;  Surgeon: Nevada Crane, MD;  Location: [facility];  Service: Ophthalmology;  Laterality: Left;   CATARACT EXTRACTION W/PHACO Right 08/20/2019   Procedure: CATARACT EXTRACTION PHACO AND INTRAOCULAR LENS PLACEMENT (IOC)  ISTENT INJ RIGHT 00:25.2      8.4%       2.33;  Surgeon: Nevada Crane, MD;  Location: [facility];  Service: Ophthalmology;  Laterality: Right;   COLONOSCOPY     INSERTION OF ANTERIOR SEGMENT AQUEOUS DRAINAGE DEVICE (ISTENT) Right 08/20/2019   Procedure: INSERTION OF ANTERIOR SEGMENT AQUEOUS DRAINAGE DEVICE (ISTENT);  Surgeon: Nevada Crane, MD;  Location: [facility];  Service: Ophthalmology;  Laterality: Right;   OOPHORECTOMY      Social History   Socioeconomic History   Marital status: Married    Spouse name: Not on file   Number of children: Not on file   Years of education: Not on file   Highest education level: Not on file  Occupational History   Not on file  Tobacco Use   Smoking status: Never   Smokeless tobacco: Never   Vaping Use   Vaping Use: Never used  Substance and Sexual Activity   Alcohol use: Never    Comment: occassional   Drug use: Never   Sexual activity: Not Currently  Other Topics Concern   Not on file  Social History Narrative   Not on file   Social Determinants of Health   Financial Resource Strain: Not on file  Food Insecurity: Not on file  Transportation Needs: Not on file  Physical Activity: Not on file  Stress: Not on file  Social Connections: Not on file  Intimate Partner Violence: Not on file    Family History  Problem Relation Age of Onset   Breast cancer Neg Hx     No Known Allergies  Review of Systems  Constitutional: Negative.   HENT: Negative.    Eyes: Negative.   Respiratory: Negative.    Cardiovascular: Negative.   Gastrointestinal: Negative.   Genitourinary: Negative.   Musculoskeletal: Negative.   Skin: Negative.   Neurological: Negative.   Endo/Heme/Allergies: Negative.   Psychiatric/Behavioral: Negative.         Objective:   BP 120/70   Pulse 70   Ht 5\' 2"  (1.575 m)   Wt 137 lb 9.6 oz (62.4 kg)   SpO2 95%   BMI 25.17 kg/m   Vitals:  03/31/23 1002  BP: 120/70  Pulse: 70  Height: 5\' 2"  (1.575 m)  Weight: 137 lb 9.6 oz (62.4 kg)  SpO2: 95%  BMI (Calculated): 25.16    Physical Exam Vitals reviewed.  Constitutional:      Appearance: Normal appearance.  HENT:     Head: Normocephalic.     Nose: Nose normal.     Mouth/Throat:     Mouth: Mucous membranes are moist.  Eyes:     Pupils: Pupils are equal, round, and reactive to light.  Cardiovascular:     Rate and Rhythm: Normal rate and regular rhythm.  Pulmonary:     Effort: Pulmonary effort is normal.     Breath sounds: Normal breath sounds.  Abdominal:     General: Bowel sounds are normal.     Palpations: Abdomen is soft.  Musculoskeletal:        General: Normal range of motion.     Cervical back: Normal range of motion and neck supple.  Skin:    General: Skin is warm  and dry.  Neurological:     Mental Status: She is alert and oriented to person, place, and time.  Psychiatric:        Mood and Affect: Mood normal.        Behavior: Behavior normal.      No results found for any visits on 03/31/23.  Recent Results (from the past 2160 hour(s))  Hemoglobin A1c     Status: Abnormal   Collection Time: 03/28/23  9:31 AM  Result Value Ref Range   Hgb A1c MFr Bld 6.1 (H) 4.8 - 5.6 %    Comment:          Prediabetes: 5.7 - 6.4          Diabetes: >6.4          Glycemic control for adults with diabetes: <7.0    Est. average glucose Bld gHb Est-mCnc 128 mg/dL  TSH     Status: None   Collection Time: 03/28/23  9:31 AM  Result Value Ref Range   TSH 1.370 0.450 - 4.500 uIU/mL  CMP14+EGFR     Status: None   Collection Time: 03/28/23  9:31 AM  Result Value Ref Range   Glucose 99 70 - 99 mg/dL   BUN 14 8 - 27 mg/dL   Creatinine, Ser 8.65 0.57 - 1.00 mg/dL   eGFR 64 >78 IO/NGE/9.52   BUN/Creatinine Ratio 15 12 - 28   Sodium 143 134 - 144 mmol/L   Potassium 3.8 3.5 - 5.2 mmol/L   Chloride 100 96 - 106 mmol/L   CO2 25 20 - 29 mmol/L   Calcium 9.6 8.7 - 10.3 mg/dL   Total Protein 7.0 6.0 - 8.5 g/dL   Albumin 4.0 3.9 - 4.9 g/dL   Globulin, Total 3.0 1.5 - 4.5 g/dL   Albumin/Globulin Ratio 1.3 1.2 - 2.2   Bilirubin Total 0.4 0.0 - 1.2 mg/dL   Alkaline Phosphatase 70 44 - 121 IU/L   AST 23 0 - 40 IU/L   ALT 13 0 - 32 IU/L  Lipid panel     Status: None   Collection Time: 03/28/23  9:31 AM  Result Value Ref Range   Cholesterol, Total 147 100 - 199 mg/dL   Triglycerides 56 0 - 149 mg/dL   HDL 60 >84 mg/dL   VLDL Cholesterol Cal 12 5 - 40 mg/dL   LDL Chol Calc (NIH) 75 0 - 99 mg/dL   Chol/HDL  Ratio 2.5 0.0 - 4.4 ratio    Comment:                                   T. Chol/HDL Ratio                                             Men  Women                               1/2 Avg.Risk  3.4    3.3                                   Avg.Risk  5.0    4.4                                 2X Avg.Risk  9.6    7.1                                3X Avg.Risk 23.4   11.0       Assessment & Plan:   Problem List Items Addressed This Visit   None   No follow-ups on file.   Total time spent: 35 minutes  Orson Eva, NP  03/31/2023

## 2023-03-31 NOTE — Patient Instructions (Signed)
1) Gave patient prediabetic handout 2) Follow up appt in 6 months, fasting labs prior

## 2023-04-12 DIAGNOSIS — M7542 Impingement syndrome of left shoulder: Secondary | ICD-10-CM | POA: Diagnosis not present

## 2023-04-15 DIAGNOSIS — M1990 Unspecified osteoarthritis, unspecified site: Secondary | ICD-10-CM | POA: Diagnosis not present

## 2023-04-15 DIAGNOSIS — J309 Allergic rhinitis, unspecified: Secondary | ICD-10-CM | POA: Diagnosis not present

## 2023-04-15 DIAGNOSIS — I129 Hypertensive chronic kidney disease with stage 1 through stage 4 chronic kidney disease, or unspecified chronic kidney disease: Secondary | ICD-10-CM | POA: Diagnosis not present

## 2023-04-15 DIAGNOSIS — M810 Age-related osteoporosis without current pathological fracture: Secondary | ICD-10-CM | POA: Diagnosis not present

## 2023-04-15 DIAGNOSIS — N182 Chronic kidney disease, stage 2 (mild): Secondary | ICD-10-CM | POA: Diagnosis not present

## 2023-04-15 DIAGNOSIS — E785 Hyperlipidemia, unspecified: Secondary | ICD-10-CM | POA: Diagnosis not present

## 2023-06-06 ENCOUNTER — Other Ambulatory Visit: Payer: Self-pay | Admitting: Nurse Practitioner

## 2023-06-06 DIAGNOSIS — Z1231 Encounter for screening mammogram for malignant neoplasm of breast: Secondary | ICD-10-CM

## 2023-07-18 ENCOUNTER — Ambulatory Visit
Admission: RE | Admit: 2023-07-18 | Discharge: 2023-07-18 | Disposition: A | Discharge status: Home / Self Care | Payer: Medicare HMO | Source: Ambulatory Visit | Attending: Nurse Practitioner | Admitting: Nurse Practitioner

## 2023-07-18 DIAGNOSIS — Z1231 Encounter for screening mammogram for malignant neoplasm of breast: Secondary | ICD-10-CM | POA: Diagnosis not present

## 2023-07-27 DIAGNOSIS — H401131 Primary open-angle glaucoma, bilateral, mild stage: Secondary | ICD-10-CM | POA: Diagnosis not present

## 2023-08-02 DIAGNOSIS — H04123 Dry eye syndrome of bilateral lacrimal glands: Secondary | ICD-10-CM | POA: Diagnosis not present

## 2023-08-02 DIAGNOSIS — Z961 Presence of intraocular lens: Secondary | ICD-10-CM | POA: Diagnosis not present

## 2023-08-02 DIAGNOSIS — H401131 Primary open-angle glaucoma, bilateral, mild stage: Secondary | ICD-10-CM | POA: Diagnosis not present

## 2023-08-02 DIAGNOSIS — H26493 Other secondary cataract, bilateral: Secondary | ICD-10-CM | POA: Diagnosis not present

## 2023-09-05 DIAGNOSIS — M7542 Impingement syndrome of left shoulder: Secondary | ICD-10-CM | POA: Diagnosis not present

## 2023-09-30 ENCOUNTER — Ambulatory Visit: Payer: Medicare HMO | Admitting: Nurse Practitioner

## 2023-10-05 ENCOUNTER — Other Ambulatory Visit: Payer: Medicare HMO

## 2023-10-05 ENCOUNTER — Other Ambulatory Visit: Payer: Self-pay

## 2023-10-05 ENCOUNTER — Other Ambulatory Visit: Payer: Self-pay | Admitting: Cardiology

## 2023-10-05 DIAGNOSIS — R7303 Prediabetes: Secondary | ICD-10-CM

## 2023-10-05 DIAGNOSIS — I10 Essential (primary) hypertension: Secondary | ICD-10-CM | POA: Diagnosis not present

## 2023-10-05 DIAGNOSIS — E782 Mixed hyperlipidemia: Secondary | ICD-10-CM

## 2023-10-06 LAB — CMP14+EGFR
ALT: 17 [IU]/L (ref 0–32)
AST: 18 [IU]/L (ref 0–40)
Albumin: 4 g/dL (ref 3.9–4.9)
Alkaline Phosphatase: 59 [IU]/L (ref 44–121)
BUN/Creatinine Ratio: 18 (ref 12–28)
BUN: 16 mg/dL (ref 8–27)
Bilirubin Total: 0.4 mg/dL (ref 0.0–1.2)
CO2: 28 mmol/L (ref 20–29)
Calcium: 9.7 mg/dL (ref 8.7–10.3)
Chloride: 103 mmol/L (ref 96–106)
Creatinine, Ser: 0.87 mg/dL (ref 0.57–1.00)
Globulin, Total: 2.4 g/dL (ref 1.5–4.5)
Glucose: 86 mg/dL (ref 70–99)
Potassium: 3.8 mmol/L (ref 3.5–5.2)
Sodium: 143 mmol/L (ref 134–144)
Total Protein: 6.4 g/dL (ref 6.0–8.5)
eGFR: 72 mL/min/{1.73_m2} (ref >59)

## 2023-10-06 LAB — HEMOGLOBIN A1C
Est. average glucose Bld gHb Est-mCnc: 128 mg/dL
Hgb A1c MFr Bld: 6.1 % — ABNORMAL HIGH (ref 4.8–5.6)

## 2023-10-06 LAB — LIPID PANEL
Chol/HDL Ratio: 2.1 ratio (ref 0.0–4.4)
Cholesterol, Total: 180 mg/dL (ref 100–199)
HDL: 86 mg/dL (ref >39)
LDL Chol Calc (NIH): 85 mg/dL (ref 0–99)
Triglycerides: 41 mg/dL (ref 0–149)
VLDL Cholesterol Cal: 9 mg/dL (ref 5–40)

## 2023-10-06 LAB — TSH: TSH: 1.19 u[IU]/mL (ref 0.450–4.500)

## 2023-10-11 ENCOUNTER — Encounter: Payer: Self-pay | Admitting: Cardiology

## 2023-10-11 ENCOUNTER — Ambulatory Visit (INDEPENDENT_AMBULATORY_CARE_PROVIDER_SITE_OTHER): Payer: Medicare HMO | Admitting: Cardiology

## 2023-10-11 VITALS — BP 117/74 | HR 75 | Ht 62.0 in | Wt 141.6 lb

## 2023-10-11 DIAGNOSIS — R7303 Prediabetes: Secondary | ICD-10-CM | POA: Diagnosis not present

## 2023-10-11 DIAGNOSIS — Z1211 Encounter for screening for malignant neoplasm of colon: Secondary | ICD-10-CM | POA: Diagnosis not present

## 2023-10-11 DIAGNOSIS — E782 Mixed hyperlipidemia: Secondary | ICD-10-CM

## 2023-10-11 DIAGNOSIS — I10 Essential (primary) hypertension: Secondary | ICD-10-CM

## 2023-10-11 MED ORDER — HYDROCHLOROTHIAZIDE 25 MG PO TABS: 25.0000 mg | ORAL_TABLET | Freq: Every day | ORAL | 3 refills | Status: DC

## 2023-10-11 MED ORDER — AMLODIPINE BESYLATE 5 MG PO TABS: 5.0000 mg | ORAL_TABLET | Freq: Every day | ORAL | 3 refills | Status: DC

## 2023-10-11 MED ORDER — ATORVASTATIN CALCIUM 10 MG PO TABS: 10.0000 mg | ORAL_TABLET | Freq: Every day | ORAL | 3 refills | Status: DC

## 2023-10-11 MED ORDER — METOPROLOL SUCCINATE 50 MG PO CS24: 50.0000 mg | EXTENDED_RELEASE_CAPSULE | Freq: Every day | ORAL | 3 refills | Status: DC

## 2023-10-11 MED ORDER — PANTOPRAZOLE SODIUM 20 MG PO TBEC: 20.0000 mg | DELAYED_RELEASE_TABLET | Freq: Every day | ORAL | 1 refills | Status: DC

## 2023-10-11 NOTE — Progress Notes (Signed)
Established Patient Office Visit  Subjective:  Patient ID: Teresa Cunningham, female    DOB: 30-Mar-1951  Age: 72 y.o. MRN: [id number]  Chief Complaint  Patient presents with   Follow-up    Patient in office for 6 month follow up, discuss recent lab results. Patient doing well, complains of acid reflux. States she has tried OTC with no relief. Will send in pantoprazole.  Discussed recent lab work. Hgb A1c stable. LDL at goal. Patient up to date on mammogram. Patient due for colonoscopy next year. Referral placed.     No other concerns at this time.   Past Medical History:  Diagnosis Date   Cervical cancer (HCC)    DVT (deep venous thrombosis) (HCC)    H/O DVT right leg   GERD (gastroesophageal reflux disease)    Hypercholesterolemia    Hypertension    controlled on meds   Swelling of lower extremity    Vertigo    Wears partial dentures    bottom    Past Surgical History:  Procedure Laterality Date   ABDOMINAL HYSTERECTOMY     BREAST EXCISIONAL BIOPSY Left 1996   neg   BREAST EXCISIONAL BIOPSY Left    neg   CATARACT EXTRACTION W/PHACO Left 10/11/2017   Procedure: CATARACT EXTRACTION PHACO AND INTRAOCULAR LENS PLACEMENT (IOC) LEFT ISTENT;  Surgeon: Nevada Crane, MD;  Location: [facility];  Service: Ophthalmology;  Laterality: Left;   CATARACT EXTRACTION W/PHACO Right 08/20/2019   Procedure: CATARACT EXTRACTION PHACO AND INTRAOCULAR LENS PLACEMENT (IOC)  ISTENT INJ RIGHT 00:25.2      8.4%       2.33;  Surgeon: Nevada Crane, MD;  Location: [facility];  Service: Ophthalmology;  Laterality: Right;   COLONOSCOPY     INSERTION OF ANTERIOR SEGMENT AQUEOUS DRAINAGE DEVICE (ISTENT) Right 08/20/2019   Procedure: INSERTION OF ANTERIOR SEGMENT AQUEOUS DRAINAGE DEVICE (ISTENT);  Surgeon: Nevada Crane, MD;  Location: [facility];  Service: Ophthalmology;  Laterality: Right;   OOPHORECTOMY      Social History   Socioeconomic History    Marital status: Married    Spouse name: Not on file   Number of children: Not on file   Years of education: Not on file   Highest education level: Not on file  Occupational History   Not on file  Tobacco Use   Smoking status: Never   Smokeless tobacco: Never  Vaping Use   Vaping status: Never Used  Substance and Sexual Activity   Alcohol use: Never    Comment: occassional   Drug use: Never   Sexual activity: Not Currently  Other Topics Concern   Not on file  Social History Narrative   Not on file   Social Determinants of Health   Financial Resource Strain: Not on file  Food Insecurity: Not on file  Transportation Needs: Not on file  Physical Activity: Not on file  Stress: Not on file  Social Connections: Not on file  Intimate Partner Violence: Not on file    Family History  Problem Relation Age of Onset   Breast cancer Neg Hx     No Known Allergies  Review of Systems  Constitutional: Negative.   HENT: Negative.    Eyes: Negative.   Respiratory: Negative.  Negative for shortness of breath.   Cardiovascular: Negative.  Negative for chest pain.  Gastrointestinal:  Positive for heartburn. Negative for abdominal pain, constipation and diarrhea.  Genitourinary: Negative.   Musculoskeletal:  Negative for joint  pain and myalgias.  Skin: Negative.   Neurological: Negative.  Negative for dizziness and headaches.  Endo/Heme/Allergies: Negative.   All other systems reviewed and are negative.      Objective:   BP 117/74   Pulse 75   Ht 5\' 2"  (1.575 m)   Wt 141 lb 9.6 oz (64.2 kg)   SpO2 98%   BMI 25.90 kg/m   Vitals:   10/11/23 0918  BP: 117/74  Pulse: 75  Height: 5\' 2"  (1.575 m)  Weight: 141 lb 9.6 oz (64.2 kg)  SpO2: 98%  BMI (Calculated): 25.89    Physical Exam Vitals and nursing note reviewed.  Constitutional:      Appearance: Normal appearance. She is normal weight.  HENT:     Head: Normocephalic and atraumatic.     Nose: Nose normal.      Mouth/Throat:     Mouth: Mucous membranes are moist.  Eyes:     Extraocular Movements: Extraocular movements intact.     Conjunctiva/sclera: Conjunctivae normal.     Pupils: Pupils are equal, round, and reactive to light.  Cardiovascular:     Rate and Rhythm: Normal rate and regular rhythm.     Pulses: Normal pulses.     Heart sounds: Normal heart sounds.  Pulmonary:     Effort: Pulmonary effort is normal.     Breath sounds: Normal breath sounds.  Abdominal:     General: Abdomen is flat. Bowel sounds are normal.     Palpations: Abdomen is soft.  Musculoskeletal:        General: Normal range of motion.     Cervical back: Normal range of motion.  Skin:    General: Skin is warm and dry.  Neurological:     General: No focal deficit present.     Mental Status: She is alert and oriented to person, place, and time.  Psychiatric:        Mood and Affect: Mood normal.        Behavior: Behavior normal.        Thought Content: Thought content normal.        Judgment: Judgment normal.      No results found for any visits on 10/11/23.  Recent Results (from the past 2160 hour(s))  Hemoglobin A1c     Status: Abnormal   Collection Time: 10/05/23 10:58 AM  Result Value Ref Range   Hgb A1c MFr Bld 6.1 (H) 4.8 - 5.6 %    Comment:          Prediabetes: 5.7 - 6.4          Diabetes: >6.4          Glycemic control for adults with diabetes: <7.0    Est. average glucose Bld gHb Est-mCnc 128 mg/dL  TSH     Status: None   Collection Time: 10/05/23 10:58 AM  Result Value Ref Range   TSH 1.190 0.450 - 4.500 uIU/mL  Lipid panel     Status: None   Collection Time: 10/05/23 10:58 AM  Result Value Ref Range   Cholesterol, Total 180 100 - 199 mg/dL   Triglycerides 41 0 - 149 mg/dL   HDL 86 >40 mg/dL   VLDL Cholesterol Cal 9 5 - 40 mg/dL   LDL Chol Calc (NIH) 85 0 - 99 mg/dL   Chol/HDL Ratio 2.1 0.0 - 4.4 ratio    Comment:  T. Chol/HDL Ratio                                              Men  Women                               1/2 Avg.Risk  3.4    3.3                                   Avg.Risk  5.0    4.4                                2X Avg.Risk  9.6    7.1                                3X Avg.Risk 23.4   11.0   CMP14+EGFR     Status: None   Collection Time: 10/05/23 10:58 AM  Result Value Ref Range   Glucose 86 70 - 99 mg/dL   BUN 16 8 - 27 mg/dL   Creatinine, Ser 5.64 0.57 - 1.00 mg/dL   eGFR 72 >33 IR/JJO/8.41   BUN/Creatinine Ratio 18 12 - 28   Sodium 143 134 - 144 mmol/L   Potassium 3.8 3.5 - 5.2 mmol/L   Chloride 103 96 - 106 mmol/L   CO2 28 20 - 29 mmol/L   Calcium 9.7 8.7 - 10.3 mg/dL   Total Protein 6.4 6.0 - 8.5 g/dL   Albumin 4.0 3.9 - 4.9 g/dL   Globulin, Total 2.4 1.5 - 4.5 g/dL   Bilirubin Total 0.4 0.0 - 1.2 mg/dL   Alkaline Phosphatase 59 44 - 121 IU/L   AST 18 0 - 40 IU/L   ALT 17 0 - 32 IU/L      Assessment & Plan:  Pantoprazole for acid reflux.  Referral for colonoscopy placed.   Problem List Items Addressed This Visit       Cardiovascular and Mediastinum   Essential hypertension, benign - Primary   Relevant Medications   amLODipine (NORVASC) 5 MG tablet   hydrochlorothiazide (HYDRODIURIL) 25 MG tablet   Metoprolol Succinate 50 MG CS24   atorvastatin (LIPITOR) 10 MG tablet     Other   Prediabetes   Mixed hyperlipidemia   Relevant Medications   amLODipine (NORVASC) 5 MG tablet   hydrochlorothiazide (HYDRODIURIL) 25 MG tablet   Metoprolol Succinate 50 MG CS24   atorvastatin (LIPITOR) 10 MG tablet   Other Visit Diagnoses     Colon cancer screening       Relevant Orders   Ambulatory referral to Gastroenterology       Return in about 6 months (around 04/09/2024) for with fastinglabs prior.   Total time spent: 25 minutes  [employer], NP  10/11/2023   This document may have been prepared by Dragon Voice Recognition software and as such may include unintentional dictation errors.

## 2023-11-11 DIAGNOSIS — Z0000 Encounter for general adult medical examination without abnormal findings: Secondary | ICD-10-CM | POA: Diagnosis not present

## 2023-11-18 ENCOUNTER — Other Ambulatory Visit: Payer: Self-pay | Admitting: Cardiology

## 2024-01-08 ENCOUNTER — Ambulatory Visit
Admission: EM | Admit: 2024-01-08 | Discharge: 2024-01-08 | Disposition: A | Discharge status: Home / Self Care | Payer: Medicare HMO | Attending: Emergency Medicine | Admitting: Emergency Medicine

## 2024-01-08 DIAGNOSIS — J069 Acute upper respiratory infection, unspecified: Secondary | ICD-10-CM

## 2024-01-08 MED ORDER — BENZONATATE 100 MG PO CAPS: 200.0000 mg | ORAL_CAPSULE | Freq: Three times a day (TID) | ORAL | 0 refills | Status: DC

## 2024-01-08 MED ORDER — AMOXICILLIN-POT CLAVULANATE 875-125 MG PO TABS: 1.0000 | ORAL_TABLET | Freq: Two times a day (BID) | ORAL | 0 refills | Status: DC

## 2024-01-08 MED ORDER — IPRATROPIUM BROMIDE 0.06 % NA SOLN: 2.0000 | Freq: Four times a day (QID) | NASAL | 12 refills | Status: DC

## 2024-01-08 MED ORDER — AMOXICILLIN-POT CLAVULANATE 875-125 MG PO TABS: 1.0000 | ORAL_TABLET | Freq: Two times a day (BID) | ORAL | 0 refills | Status: AC

## 2024-01-08 MED ORDER — PROMETHAZINE-DM 6.25-15 MG/5ML PO SYRP: 5.0000 mL | ORAL_SOLUTION | Freq: Four times a day (QID) | ORAL | 0 refills | Status: DC | PRN

## 2024-01-08 NOTE — ED Triage Notes (Signed)
 Sx x 2 weeks  Cough Post nasal drip worse at night

## 2024-01-08 NOTE — Discharge Instructions (Addendum)
 Take the Augmentin 875 mg twice daily with food for 7 days for treatment of your upper respiratory infection.  Use over-the-counter Tylenol and/or ibuprofen according the package instructions as needed for any fever or pain.  Use the Atrovent nasal spray, 2 squirts in each nostril every 6 hours, as needed for runny nose and postnasal drip.  Use the Tessalon Perles every 8 hours during the day.  Take them with a small sip of water.  They may give you some numbness to the base of your tongue or a metallic taste in your mouth, this is normal.  Use the Promethazine DM cough syrup at bedtime for cough and congestion.  It will make you drowsy so do not take it during the day.  Return for reevaluation or see your primary care provider for any new or worsening symptoms.

## 2024-01-08 NOTE — ED Provider Notes (Signed)
 [facility]    CSN: [id number] Arrival date & time: 01/08/24  9147      History   Chief Complaint Chief Complaint  Patient presents with   Cough    HPI Teresa Cunningham is a 73 y.o. female.   HPI  73 year old female with past medical history significant for GERD, hypertension, cervical cancer, hypercholesterolemia, DVT, and vertigo presents for evaluation of 2 weeks worth of respiratory symptoms which include chills, nasal congestion with a mixed nasal discharge that has included bloody discharge, postnasal drip that is worse at night, and a productive cough.  She denies any fever, ear pain, sore throat, shortness with, or wheezing.  Past Medical History:  Diagnosis Date   Cervical cancer (HCC)    DVT (deep venous thrombosis) (HCC)    H/O DVT right leg   GERD (gastroesophageal reflux disease)    Hypercholesterolemia    Hypertension    controlled on meds   Swelling of lower extremity    Vertigo    Wears partial dentures    bottom    Patient Active Problem List   Diagnosis Date Noted   Prediabetes 03/31/2023   Essential hypertension, benign 03/31/2023   Mixed hyperlipidemia 03/31/2023    Past Surgical History:  Procedure Laterality Date   ABDOMINAL HYSTERECTOMY     BREAST EXCISIONAL BIOPSY Left 1996   neg   BREAST EXCISIONAL BIOPSY Left    neg   CATARACT EXTRACTION W/PHACO Left 10/11/2017   Procedure: CATARACT EXTRACTION PHACO AND INTRAOCULAR LENS PLACEMENT (IOC) LEFT ISTENT;  Surgeon: Myrna Adine Anes, MD;  Location: [facility];  Service: Ophthalmology;  Laterality: Left;   CATARACT EXTRACTION W/PHACO Right 08/20/2019   Procedure: CATARACT EXTRACTION PHACO AND INTRAOCULAR LENS PLACEMENT (IOC)  ISTENT INJ RIGHT 00:25.2      8.4%       2.33;  Surgeon: Myrna Adine Anes, MD;  Location: [facility];  Service: Ophthalmology;  Laterality: Right;   COLONOSCOPY     INSERTION OF ANTERIOR SEGMENT AQUEOUS DRAINAGE DEVICE (ISTENT) Right  08/20/2019   Procedure: INSERTION OF ANTERIOR SEGMENT AQUEOUS DRAINAGE DEVICE (ISTENT);  Surgeon: Myrna Adine Anes, MD;  Location: [facility];  Service: Ophthalmology;  Laterality: Right;   OOPHORECTOMY      OB History   No obstetric history on file.      Home Medications    Prior to Admission medications   Medication Sig Start Date End Date Taking? Authorizing Provider  amLODipine  (NORVASC ) 5 MG tablet Take 1 tablet (5 mg total) by mouth daily. am 10/11/23  Yes Scoggins, Amber, NP  amoxicillin -clavulanate (AUGMENTIN ) 875-125 MG tablet Take 1 tablet by mouth every 12 (twelve) hours for 7 days. 01/08/24 01/15/24 Yes Bernardino Ditch, NP  atorvastatin  (LIPITOR) 10 MG tablet Take 1 tablet (10 mg total) by mouth daily. 10/11/23  Yes Scoggins, Hospital Doctor, NP  benzonatate  (TESSALON ) 100 MG capsule Take 2 capsules (200 mg total) by mouth every 8 (eight) hours. 01/08/24  Yes Bernardino Ditch, NP  hydrochlorothiazide  (HYDRODIURIL ) 25 MG tablet Take 1 tablet (25 mg total) by mouth daily. am 10/11/23  Yes Scoggins, Amber, NP  ipratropium (ATROVENT ) 0.06 % nasal spray Place 2 sprays into both nostrils 4 (four) times daily. 01/08/24  Yes Bernardino Ditch, NP  Metoprolol  Succinate 50 MG CS24 Take 50 mg by mouth daily. 10/11/23  Yes Scoggins, Hospital Doctor, NP  pantoprazole  (PROTONIX ) 20 MG tablet TAKE ONE TABLET BY MOUTH ONCE DAILY 11/18/23  Yes Scoggins, Amber, NP  promethazine -dextromethorphan (PROMETHAZINE -DM) 6.25-15 MG/5ML  syrup Take 5 mLs by mouth 4 (four) times daily as needed. 01/08/24  Yes Bernardino Ditch, NP  Multiple Vitamins-Minerals (MULTIPLE VITAMINS/WOMENS PO) Take 1 tablet by mouth daily.    [provider]  Vitamin D, Ergocalciferol, 50000 units CAPS Take 1 tablet by mouth daily.    [provider]    Family History Family History  Problem Relation Age of Onset   Breast cancer Neg Hx     Social History Social History   Tobacco Use   Smoking status: Never   Smokeless tobacco: Never   Vaping Use   Vaping status: Never Used  Substance Use Topics   Alcohol use: Never    Comment: occassional   Drug use: Never     Allergies   Patient has no known allergies.   Review of Systems Review of Systems  Constitutional:  Negative for fever.  HENT:  Positive for congestion, postnasal drip and rhinorrhea. Negative for ear pain and sore throat.   Respiratory:  Positive for cough. Negative for shortness of breath and wheezing.      Physical Exam Triage Vital Signs ED Triage Vitals  Encounter Vitals Group     BP 01/08/24 0951 125/68     Systolic BP Percentile --      Diastolic BP Percentile --      Pulse Rate 01/08/24 0951 73     Resp 01/08/24 0951 16     Temp 01/08/24 0951 98.7 F (37.1 C)     Temp Source 01/08/24 0951 Oral     SpO2 01/08/24 0951 99 %     Weight --      Height --      Head Circumference --      Peak Flow --      Pain Score 01/08/24 0950 0     Pain Loc --      Pain Education --      Exclude from Growth Chart --    No data found.  Updated Vital Signs BP 125/68 (BP Location: Left Arm)   Pulse 73   Temp 98.7 F (37.1 C) (Oral)   Resp 16   SpO2 99%   Visual Acuity Right Eye Distance:   Left Eye Distance:   Bilateral Distance:    Right Eye Near:   Left Eye Near:    Bilateral Near:     Physical Exam Vitals and nursing note reviewed.  Constitutional:      Appearance: Normal appearance. She is not ill-appearing.  HENT:     Head: Normocephalic and atraumatic.     Right Ear: Tympanic membrane, ear canal and external ear normal. There is no impacted cerumen.     Left Ear: Tympanic membrane, ear canal and external ear normal. There is no impacted cerumen.     Nose: Congestion and rhinorrhea present.     Comments: This mucosa is erythematous and edematous with bloody discharge in both nares.    Mouth/Throat:     Mouth: Mucous membranes are moist.     Pharynx: Oropharynx is clear. Posterior oropharyngeal erythema present. No  oropharyngeal exudate.     Comments: Mild erythema to the posterior pharynx with clear postnasal drip. Cardiovascular:     Rate and Rhythm: Normal rate and regular rhythm.     Pulses: Normal pulses.     Heart sounds: Normal heart sounds. No murmur heard.    No friction rub. No gallop.  Pulmonary:     Effort: Pulmonary effort is normal.  Breath sounds: Normal breath sounds. No wheezing, rhonchi or rales.  Musculoskeletal:     Cervical back: Normal range of motion and neck supple. No tenderness.  Lymphadenopathy:     Cervical: No cervical adenopathy.  Skin:    General: Skin is warm and dry.     Capillary Refill: Capillary refill takes less than 2 seconds.     Findings: No rash.  Neurological:     General: No focal deficit present.     Mental Status: She is alert and oriented to person, place, and time.      UC Treatments / Results  Labs (all labs ordered are listed, but only abnormal results are displayed) Labs Reviewed - No data to display  EKG   Radiology No results found.  Procedures Procedures (including critical care time)  Medications Ordered in UC Medications - No data to display  Initial Impression / Assessment and Plan / UC Course  I have reviewed the triage vital signs and the nursing notes.  Pertinent labs & imaging results that were available during my care of the patient were reviewed by me and considered in my medical decision making (see chart for details).   Patient is a pleasant, nontoxic-appearing 73 year old female presenting for evaluation of respiratory symptoms as outlined HPI above.  In the exam room she is not in any acute distress and she is able to speak in full sentence without dyspnea or tachypnea.  Respiratory rate at triage was 16 with a 99% room air saturation.  She is afebrile with an oral temp of 98.7.  Her physical exam does reveal inflammation of her upper respiratory tract with Inflamase mucosa and bloody discharge in both nares.   There is erythema to the posterior pharynx with clear postnasal drip.  Cardiopulmonary exam reveals: Sounds all fields.  Her exam is consistent with an upper respiratory infection.  Given that she has had symptoms for 2 weeks I feel a trial of antibiotics is warranted.  I will discharge her home with a diagnosis of URI with cough and congestion on Augmentin  875 mg twice daily with food for 7 days.  Atrovent  nasal spray to help with nasal congestion postnasal drip.  Tessalon  Perles and Promethazine  DM cough syrup for cough and congestion.  Return precautions reviewed.   Final Clinical Impressions(s) / UC Diagnoses   Final diagnoses:  URI with cough and congestion     Discharge Instructions      Take the Augmentin  875 mg twice daily with food for 7 days for treatment of your upper respiratory infection.  Use over-the-counter Tylenol  and/or ibuprofen according the package instructions as needed for any fever or pain.  Use the Atrovent  nasal spray, 2 squirts in each nostril every 6 hours, as needed for runny nose and postnasal drip.  Use the Tessalon  Perles every 8 hours during the day.  Take them with a small sip of water.  They may give you some numbness to the base of your tongue or a metallic taste in your mouth, this is normal.  Use the Promethazine  DM cough syrup at bedtime for cough and congestion.  It will make you drowsy so do not take it during the day.  Return for reevaluation or see your primary care provider for any new or worsening symptoms.      ED Prescriptions     Medication Sig Dispense Auth. Provider   amoxicillin -clavulanate (AUGMENTIN ) 875-125 MG tablet Take 1 tablet by mouth every 12 (twelve) hours for 7 days. 14  tablet Bernardino Ditch, NP   benzonatate  (TESSALON ) 100 MG capsule Take 2 capsules (200 mg total) by mouth every 8 (eight) hours. 21 capsule Bernardino Ditch, NP   ipratropium (ATROVENT ) 0.06 % nasal spray Place 2 sprays into both nostrils 4 (four) times daily. 15  mL Bernardino Ditch, NP   promethazine -dextromethorphan (PROMETHAZINE -DM) 6.25-15 MG/5ML syrup Take 5 mLs by mouth 4 (four) times daily as needed. 118 mL Bernardino Ditch, NP      PDMP not reviewed this encounter.   Bernardino Ditch, NP 01/08/24 1010

## 2024-01-31 DIAGNOSIS — Z1211 Encounter for screening for malignant neoplasm of colon: Secondary | ICD-10-CM | POA: Diagnosis not present

## 2024-01-31 DIAGNOSIS — Z01818 Encounter for other preprocedural examination: Secondary | ICD-10-CM | POA: Diagnosis not present

## 2024-02-16 ENCOUNTER — Encounter: Payer: Self-pay | Admitting: Cardiology

## 2024-02-16 ENCOUNTER — Ambulatory Visit (INDEPENDENT_AMBULATORY_CARE_PROVIDER_SITE_OTHER): Admitting: Cardiology

## 2024-02-16 VITALS — BP 134/88 | HR 80 | Ht 62.0 in | Wt 131.0 lb

## 2024-02-16 DIAGNOSIS — I10 Essential (primary) hypertension: Secondary | ICD-10-CM

## 2024-02-16 DIAGNOSIS — M79605 Pain in left leg: Secondary | ICD-10-CM | POA: Diagnosis not present

## 2024-02-16 NOTE — Progress Notes (Signed)
 Established Patient Office Visit  Subjective:  Patient ID: Teresa Cunningham, female    DOB: Apr 29, 1951  Age: 73 y.o. MRN: [id number]  Chief Complaint  Patient presents with   Acute Visit    Left Leg Pain/ Sciatic Pain down leg into foot and toes    Patient in office for an acute visit. Patient complaining of left lower back and leg pain. Patient states pain started about a week ago. States she received a cortisone shot in her shoulder on 02/14/2024, since then back pain and leg pain have resolved. Patient to notify office if pain returns, will order imaging.     No other concerns at this time.   Past Medical History:  Diagnosis Date   Cervical cancer (HCC)    DVT (deep venous thrombosis) (HCC)    H/O DVT right leg   GERD (gastroesophageal reflux disease)    Hypercholesterolemia    Hypertension    controlled on meds   Swelling of lower extremity    Vertigo    Wears partial dentures    bottom    Past Surgical History:  Procedure Laterality Date   ABDOMINAL HYSTERECTOMY     BREAST EXCISIONAL BIOPSY Left 1996   neg   BREAST EXCISIONAL BIOPSY Left    neg   CATARACT EXTRACTION W/PHACO Left 10/11/2017   Procedure: CATARACT EXTRACTION PHACO AND INTRAOCULAR LENS PLACEMENT (IOC) LEFT ISTENT;  Surgeon: Nevada Crane, MD;  Location: [facility];  Service: Ophthalmology;  Laterality: Left;   CATARACT EXTRACTION W/PHACO Right 08/20/2019   Procedure: CATARACT EXTRACTION PHACO AND INTRAOCULAR LENS PLACEMENT (IOC)  ISTENT INJ RIGHT 00:25.2      8.4%       2.33;  Surgeon: Nevada Crane, MD;  Location: [facility];  Service: Ophthalmology;  Laterality: Right;   COLONOSCOPY     INSERTION OF ANTERIOR SEGMENT AQUEOUS DRAINAGE DEVICE (ISTENT) Right 08/20/2019   Procedure: INSERTION OF ANTERIOR SEGMENT AQUEOUS DRAINAGE DEVICE (ISTENT);  Surgeon: Nevada Crane, MD;  Location: [facility];  Service: Ophthalmology;  Laterality: Right;   OOPHORECTOMY       Social History   Socioeconomic History   Marital status: Married    Spouse name: Not on file   Number of children: Not on file   Years of education: Not on file   Highest education level: Not on file  Occupational History   Not on file  Tobacco Use   Smoking status: Never   Smokeless tobacco: Never  Vaping Use   Vaping status: Never Used  Substance and Sexual Activity   Alcohol use: Never    Comment: occassional   Drug use: Never   Sexual activity: Not Currently  Other Topics Concern   Not on file  Social History Narrative   Not on file   Social Drivers of Health   Financial Resource Strain: Low Risk  (01/31/2024)   Received from [facility]   Overall Financial Resource Strain (CARDIA)    Difficulty of Paying Living Expenses: Not hard at all  Food Insecurity: No Food Insecurity (01/31/2024)   Received from [facility]   Hunger Vital Sign    Worried About Running Out of Food in the Last Year: Never true    Ran Out of Food in the Last Year: Never true  Transportation Needs: No Transportation Needs (01/31/2024)   Received from [facility]    In the past 12 months, has  lack of transportation kept you from medical appointments or from getting medications?: No    Lack of Transportation (Non-Medical): No  Physical Activity: Not on file  Stress: Not on file  Social Connections: Not on file  Intimate Partner Violence: Not on file    Family History  Problem Relation Age of Onset   Breast cancer Neg Hx     No Known Allergies  Outpatient Medications Prior to Visit  Medication Sig   amLODipine (NORVASC) 5 MG tablet Take 1 tablet (5 mg total) by mouth daily. am   atorvastatin (LIPITOR) 10 MG tablet Take 1 tablet (10 mg total) by mouth daily.   benzonatate (TESSALON) 100 MG capsule Take 2 capsules (200 mg total) by mouth every 8 (eight) hours.   hydrochlorothiazide (HYDRODIURIL) 25 MG tablet  Take 1 tablet (25 mg total) by mouth daily. am   ipratropium (ATROVENT) 0.06 % nasal spray Place 2 sprays into both nostrils 4 (four) times daily.   Metoprolol Succinate 50 MG CS24 Take 50 mg by mouth daily.   Multiple Vitamins-Minerals (MULTIPLE VITAMINS/WOMENS PO) Take 1 tablet by mouth daily.   pantoprazole (PROTONIX) 20 MG tablet TAKE ONE TABLET BY MOUTH ONCE DAILY   promethazine-dextromethorphan (PROMETHAZINE-DM) 6.25-15 MG/5ML syrup Take 5 mLs by mouth 4 (four) times daily as needed.   Vitamin D, Ergocalciferol, 50000 units CAPS Take 1 tablet by mouth daily.   No facility-administered medications prior to visit.    Review of Systems  Constitutional: Negative.   HENT: Negative.    Eyes: Negative.   Respiratory: Negative.  Negative for shortness of breath.   Cardiovascular: Negative.  Negative for chest pain.  Gastrointestinal: Negative.  Negative for abdominal pain, constipation and diarrhea.  Genitourinary: Negative.   Musculoskeletal:  Positive for back pain. Negative for joint pain and myalgias.       Left leg pain  Skin: Negative.   Neurological: Negative.  Negative for dizziness and headaches.  Endo/Heme/Allergies: Negative.   All other systems reviewed and are negative.      Objective:   BP 134/88   Pulse 80   Ht 5\' 2"  (1.575 m)   Wt 131 lb (59.4 kg)   SpO2 96%   BMI 23.96 kg/m   Vitals:   02/16/24 1348  BP: 134/88  Pulse: 80  Height: 5\' 2"  (1.575 m)  Weight: 131 lb (59.4 kg)  SpO2: 96%  BMI (Calculated): 23.95    Physical Exam Vitals and nursing note reviewed.  Constitutional:      Appearance: Normal appearance. She is normal weight.  HENT:     Head: Normocephalic and atraumatic.     Nose: Nose normal.     Mouth/Throat:     Mouth: Mucous membranes are moist.  Eyes:     Extraocular Movements: Extraocular movements intact.     Conjunctiva/sclera: Conjunctivae normal.     Pupils: Pupils are equal, round, and reactive to light.  Cardiovascular:      Rate and Rhythm: Normal rate and regular rhythm.     Pulses: Normal pulses.     Heart sounds: Normal heart sounds.  Pulmonary:     Effort: Pulmonary effort is normal.     Breath sounds: Normal breath sounds.  Abdominal:     General: Abdomen is flat. Bowel sounds are normal.     Palpations: Abdomen is soft.  Musculoskeletal:        General: Normal range of motion.     Cervical back: Normal range of motion.  Skin:  General: Skin is warm and dry.  Neurological:     General: No focal deficit present.     Mental Status: She is alert and oriented to person, place, and time.  Psychiatric:        Mood and Affect: Mood normal.        Behavior: Behavior normal.        Thought Content: Thought content normal.        Judgment: Judgment normal.      No results found for any visits on 02/16/24.  No results found for this or any previous visit (from the past 2160 hours).    Assessment & Plan:  Notify provider if pain returns, will order imaging.   Problem List Items Addressed This Visit       Other   Left leg pain - Primary    Return if symptoms worsen or fail to improve, for Keep May appt.   Total time spent: 25 minutes  Google, NP  02/16/2024   This document may have been prepared by Dragon Voice Recognition software and as such may include unintentional dictation errors.

## 2024-02-23 ENCOUNTER — Ambulatory Visit

## 2024-02-23 DIAGNOSIS — Z1211 Encounter for screening for malignant neoplasm of colon: Secondary | ICD-10-CM | POA: Diagnosis not present

## 2024-02-23 DIAGNOSIS — D123 Benign neoplasm of transverse colon: Secondary | ICD-10-CM | POA: Diagnosis not present

## 2024-02-23 DIAGNOSIS — K640 First degree hemorrhoids: Secondary | ICD-10-CM | POA: Diagnosis not present

## 2024-02-23 DIAGNOSIS — D126 Benign neoplasm of colon, unspecified: Secondary | ICD-10-CM | POA: Diagnosis not present

## 2024-03-01 ENCOUNTER — Other Ambulatory Visit: Payer: Self-pay

## 2024-03-01 MED ORDER — PANTOPRAZOLE SODIUM 20 MG PO TBEC: 20.0000 mg | DELAYED_RELEASE_TABLET | Freq: Every day | ORAL | 1 refills | Status: DC

## 2024-04-03 ENCOUNTER — Other Ambulatory Visit

## 2024-04-03 DIAGNOSIS — E782 Mixed hyperlipidemia: Secondary | ICD-10-CM

## 2024-04-03 DIAGNOSIS — I10 Essential (primary) hypertension: Secondary | ICD-10-CM

## 2024-04-03 DIAGNOSIS — R7303 Prediabetes: Secondary | ICD-10-CM | POA: Diagnosis not present

## 2024-04-04 LAB — CMP14+EGFR
ALT: 14 IU/L (ref 0–32)
AST: 19 IU/L (ref 0–40)
Albumin: 4 g/dL (ref 3.8–4.8)
Alkaline Phosphatase: 71 IU/L (ref 44–121)
BUN/Creatinine Ratio: 17 (ref 12–28)
BUN: 15 mg/dL (ref 8–27)
Bilirubin Total: 0.5 mg/dL (ref 0.0–1.2)
CO2: 26 mmol/L (ref 20–29)
Calcium: 9.9 mg/dL (ref 8.7–10.3)
Chloride: 100 mmol/L (ref 96–106)
Creatinine, Ser: 0.89 mg/dL (ref 0.57–1.00)
Globulin, Total: 3 g/dL (ref 1.5–4.5)
Glucose: 82 mg/dL (ref 70–99)
Potassium: 3.7 mmol/L (ref 3.5–5.2)
Sodium: 141 mmol/L (ref 134–144)
Total Protein: 7 g/dL (ref 6.0–8.5)
eGFR: 69 mL/min/{1.73_m2} (ref >59)

## 2024-04-04 LAB — HEMOGLOBIN A1C
Est. average glucose Bld gHb Est-mCnc: 131 mg/dL
Hgb A1c MFr Bld: 6.2 % — ABNORMAL HIGH (ref 4.8–5.6)

## 2024-04-04 LAB — LIPID PANEL
Chol/HDL Ratio: 2.4 ratio (ref 0.0–4.4)
Cholesterol, Total: 170 mg/dL (ref 100–199)
HDL: 71 mg/dL (ref >39)
LDL Chol Calc (NIH): 90 mg/dL (ref 0–99)
Triglycerides: 44 mg/dL (ref 0–149)
VLDL Cholesterol Cal: 9 mg/dL (ref 5–40)

## 2024-04-04 LAB — TSH: TSH: 1.6 u[IU]/mL (ref 0.450–4.500)

## 2024-04-06 DIAGNOSIS — J309 Allergic rhinitis, unspecified: Secondary | ICD-10-CM | POA: Diagnosis not present

## 2024-04-06 DIAGNOSIS — R32 Unspecified urinary incontinence: Secondary | ICD-10-CM | POA: Diagnosis not present

## 2024-04-06 DIAGNOSIS — Z809 Family history of malignant neoplasm, unspecified: Secondary | ICD-10-CM | POA: Diagnosis not present

## 2024-04-06 DIAGNOSIS — K219 Gastro-esophageal reflux disease without esophagitis: Secondary | ICD-10-CM | POA: Diagnosis not present

## 2024-04-06 DIAGNOSIS — H409 Unspecified glaucoma: Secondary | ICD-10-CM | POA: Diagnosis not present

## 2024-04-06 DIAGNOSIS — E785 Hyperlipidemia, unspecified: Secondary | ICD-10-CM | POA: Diagnosis not present

## 2024-04-06 DIAGNOSIS — N182 Chronic kidney disease, stage 2 (mild): Secondary | ICD-10-CM | POA: Diagnosis not present

## 2024-04-06 DIAGNOSIS — Z8249 Family history of ischemic heart disease and other diseases of the circulatory system: Secondary | ICD-10-CM | POA: Diagnosis not present

## 2024-04-06 DIAGNOSIS — M1990 Unspecified osteoarthritis, unspecified site: Secondary | ICD-10-CM | POA: Diagnosis not present

## 2024-04-06 DIAGNOSIS — R7303 Prediabetes: Secondary | ICD-10-CM | POA: Diagnosis not present

## 2024-04-06 DIAGNOSIS — Z833 Family history of diabetes mellitus: Secondary | ICD-10-CM | POA: Diagnosis not present

## 2024-04-06 DIAGNOSIS — M810 Age-related osteoporosis without current pathological fracture: Secondary | ICD-10-CM | POA: Diagnosis not present

## 2024-04-09 ENCOUNTER — Encounter: Payer: Self-pay | Admitting: Cardiology

## 2024-04-09 ENCOUNTER — Ambulatory Visit (INDEPENDENT_AMBULATORY_CARE_PROVIDER_SITE_OTHER): Payer: Medicare HMO | Admitting: Cardiology

## 2024-04-09 VITALS — BP 130/82 | HR 78 | Ht 62.0 in | Wt 137.6 lb

## 2024-04-09 DIAGNOSIS — R7303 Prediabetes: Secondary | ICD-10-CM | POA: Diagnosis not present

## 2024-04-09 DIAGNOSIS — I10 Essential (primary) hypertension: Secondary | ICD-10-CM

## 2024-04-09 DIAGNOSIS — E782 Mixed hyperlipidemia: Secondary | ICD-10-CM

## 2024-04-09 NOTE — Progress Notes (Signed)
 Established Patient Office Visit  Subjective:  Patient ID: Teresa Cunningham, female    DOB: 11/25/1951  Age: 73 y.o. MRN: [id number]  Chief Complaint  Patient presents with   Follow-up    6 month lab results    Patient in office for 6 month follow up, discuss recent lab results. Patient doing well, no complaints today. Discussed recent lab work. LDL at goal. Hgb A1c increased, diabetic diet handout given to patient.  Continue same medications.     No other concerns at this time.   Past Medical History:  Diagnosis Date   Cervical cancer (HCC)    DVT (deep venous thrombosis) (HCC)    H/O DVT right leg   GERD (gastroesophageal reflux disease)    Hypercholesterolemia    Hypertension    controlled on meds   Swelling of lower extremity    Vertigo    Wears partial dentures    bottom    Past Surgical History:  Procedure Laterality Date   ABDOMINAL HYSTERECTOMY     BREAST EXCISIONAL BIOPSY Left 1996   neg   BREAST EXCISIONAL BIOPSY Left    neg   CATARACT EXTRACTION W/PHACO Left 10/11/2017   Procedure: CATARACT EXTRACTION PHACO AND INTRAOCULAR LENS PLACEMENT (IOC) LEFT ISTENT;  Surgeon: Rosa College, MD;  Location: [facility];  Service: Ophthalmology;  Laterality: Left;   CATARACT EXTRACTION W/PHACO Right 08/20/2019   Procedure: CATARACT EXTRACTION PHACO AND INTRAOCULAR LENS PLACEMENT (IOC)  ISTENT INJ RIGHT 00:25.2      8.4%       2.33;  Surgeon: Rosa College, MD;  Location: [facility];  Service: Ophthalmology;  Laterality: Right;   COLONOSCOPY     INSERTION OF ANTERIOR SEGMENT AQUEOUS DRAINAGE DEVICE (ISTENT) Right 08/20/2019   Procedure: INSERTION OF ANTERIOR SEGMENT AQUEOUS DRAINAGE DEVICE (ISTENT);  Surgeon: Rosa College, MD;  Location: [facility];  Service: Ophthalmology;  Laterality: Right;   OOPHORECTOMY      Social History   Socioeconomic History   Marital status: Married    Spouse name: Not on file   Number of  children: Not on file   Years of education: Not on file   Highest education level: Not on file  Occupational History   Not on file  Tobacco Use   Smoking status: Never   Smokeless tobacco: Never  Vaping Use   Vaping status: Never Used  Substance and Sexual Activity   Alcohol use: Never    Comment: occassional   Drug use: Never   Sexual activity: Not Currently  Other Topics Concern   Not on file  Social History Narrative   Not on file   Social Drivers of Health   Financial Resource Strain: Low Risk  (01/31/2024)   Received from [facility]   Overall Financial Resource Strain (CARDIA)    Difficulty of Paying Living Expenses: Not hard at all  Food Insecurity: No Food Insecurity (01/31/2024)   Received from [facility]   Hunger Vital Sign    Worried About Running Out of Food in the Last Year: Never true    Ran Out of Food in the Last Year: Never true  Transportation Needs: No Transportation Needs (01/31/2024)   Received from [facility] - Transportation    In the past 12 months, has lack of transportation kept you from medical appointments or from getting medications?: No    Lack of Transportation (Non-Medical): No  Physical Activity:  Not on file  Stress: Not on file  Social Connections: Not on file  Intimate Partner Violence: Not on file    Family History  Problem Relation Age of Onset   Breast cancer Neg Hx     No Known Allergies  Outpatient Medications Prior to Visit  Medication Sig   amLODipine  (NORVASC ) 5 MG tablet Take 1 tablet (5 mg total) by mouth daily. am   atorvastatin  (LIPITOR) 10 MG tablet Take 1 tablet (10 mg total) by mouth daily.   hydrochlorothiazide  (HYDRODIURIL ) 25 MG tablet Take 1 tablet (25 mg total) by mouth daily. am   Metoprolol  Succinate 50 MG CS24 Take 50 mg by mouth daily.   Multiple Vitamins-Minerals (MULTIPLE VITAMINS/WOMENS PO) Take 1 tablet by mouth daily.   pantoprazole   (PROTONIX ) 20 MG tablet Take 1 tablet (20 mg total) by mouth daily.   Vitamin D, Ergocalciferol, 50000 units CAPS Take 1 tablet by mouth daily.   [DISCONTINUED] benzonatate  (TESSALON ) 100 MG capsule Take 2 capsules (200 mg total) by mouth every 8 (eight) hours. (Patient not taking: Reported on 04/09/2024)   [DISCONTINUED] ipratropium (ATROVENT ) 0.06 % nasal spray Place 2 sprays into both nostrils 4 (four) times daily. (Patient not taking: Reported on 04/09/2024)   [DISCONTINUED] promethazine -dextromethorphan (PROMETHAZINE -DM) 6.25-15 MG/5ML syrup Take 5 mLs by mouth 4 (four) times daily as needed. (Patient not taking: Reported on 04/09/2024)   No facility-administered medications prior to visit.    Review of Systems  Constitutional: Negative.   HENT: Negative.    Eyes: Negative.   Respiratory: Negative.  Negative for shortness of breath.   Cardiovascular: Negative.  Negative for chest pain.  Gastrointestinal: Negative.  Negative for abdominal pain, constipation and diarrhea.  Genitourinary: Negative.   Musculoskeletal:  Negative for joint pain and myalgias.  Skin: Negative.   Neurological: Negative.  Negative for dizziness and headaches.  Endo/Heme/Allergies: Negative.   All other systems reviewed and are negative.      Objective:   BP 130/82   Pulse 78   Ht 5\' 2"  (1.575 m)   Wt 137 lb 9.6 oz (62.4 kg)   SpO2 97%   BMI 25.17 kg/m   Vitals:   04/09/24 0914  BP: 130/82  Pulse: 78  Height: 5\' 2"  (1.575 m)  Weight: 137 lb 9.6 oz (62.4 kg)  SpO2: 97%  BMI (Calculated): 25.16    Physical Exam Vitals and nursing note reviewed.  Constitutional:      Appearance: Normal appearance. She is normal weight.  HENT:     Head: Normocephalic and atraumatic.     Nose: Nose normal.     Mouth/Throat:     Mouth: Mucous membranes are moist.  Eyes:     Extraocular Movements: Extraocular movements intact.     Conjunctiva/sclera: Conjunctivae normal.     Pupils: Pupils are equal, round,  and reactive to light.  Cardiovascular:     Rate and Rhythm: Normal rate and regular rhythm.     Pulses: Normal pulses.     Heart sounds: Normal heart sounds.  Pulmonary:     Effort: Pulmonary effort is normal.     Breath sounds: Normal breath sounds.  Abdominal:     General: Abdomen is flat. Bowel sounds are normal.     Palpations: Abdomen is soft.  Musculoskeletal:        General: Normal range of motion.     Cervical back: Normal range of motion.  Skin:    General: Skin is warm and dry.  Neurological:  General: No focal deficit present.     Mental Status: She is alert and oriented to person, place, and time.  Psychiatric:        Mood and Affect: Mood normal.        Behavior: Behavior normal.        Thought Content: Thought content normal.        Judgment: Judgment normal.      No results found for any visits on 04/09/24.  Recent Results (from the past 2160 hours)  Hemoglobin A1c     Status: Abnormal   Collection Time: 04/03/24  9:57 AM  Result Value Ref Range   Hgb A1c MFr Bld 6.2 (H) 4.8 - 5.6 %    Comment:          Prediabetes: 5.7 - 6.4          Diabetes: >6.4          Glycemic control for adults with diabetes: <7.0    Est. average glucose Bld gHb Est-mCnc 131 mg/dL  TSH     Status: None   Collection Time: 04/03/24  9:57 AM  Result Value Ref Range   TSH 1.600 0.450 - 4.500 uIU/mL  CMP14+EGFR     Status: None   Collection Time: 04/03/24  9:57 AM  Result Value Ref Range   Glucose 82 70 - 99 mg/dL   BUN 15 8 - 27 mg/dL   Creatinine, Ser 1.61 0.57 - 1.00 mg/dL   eGFR 69 >09 UE/AVW/0.98   BUN/Creatinine Ratio 17 12 - 28   Sodium 141 134 - 144 mmol/L   Potassium 3.7 3.5 - 5.2 mmol/L   Chloride 100 96 - 106 mmol/L   CO2 26 20 - 29 mmol/L   Calcium  9.9 8.7 - 10.3 mg/dL   Total Protein 7.0 6.0 - 8.5 g/dL   Albumin 4.0 3.8 - 4.8 g/dL   Globulin, Total 3.0 1.5 - 4.5 g/dL   Bilirubin Total 0.5 0.0 - 1.2 mg/dL   Alkaline Phosphatase 71 44 - 121 IU/L   AST 19 0  - 40 IU/L   ALT 14 0 - 32 IU/L  Lipid panel     Status: None   Collection Time: 04/03/24  9:57 AM  Result Value Ref Range   Cholesterol, Total 170 100 - 199 mg/dL   Triglycerides 44 0 - 149 mg/dL   HDL 71 >11 mg/dL   VLDL Cholesterol Cal 9 5 - 40 mg/dL   LDL Chol Calc (NIH) 90 0 - 99 mg/dL   Chol/HDL Ratio 2.4 0.0 - 4.4 ratio    Comment:                                   T. Chol/HDL Ratio                                             Men  Women                               1/2 Avg.Risk  3.4    3.3  Avg.Risk  5.0    4.4                                2X Avg.Risk  9.6    7.1                                3X Avg.Risk 23.4   11.0       Assessment & Plan:   Diabetic diet to lower Hgb A1c. Continue same medications.  Problem List Items Addressed This Visit       Cardiovascular and Mediastinum   Essential hypertension, benign - Primary     Other   Prediabetes   Mixed hyperlipidemia    Return in about 6 months (around 10/10/2024) for with fasting labs prior.   Total time spent: 25 minutes  [employer], NP  04/09/2024   This document may have been prepared by Dragon Voice Recognition software and as such may include unintentional dictation errors.

## 2024-05-07 ENCOUNTER — Encounter: Payer: Self-pay | Admitting: Internal Medicine

## 2024-05-07 ENCOUNTER — Ambulatory Visit (INDEPENDENT_AMBULATORY_CARE_PROVIDER_SITE_OTHER): Admitting: Internal Medicine

## 2024-05-07 VITALS — BP 114/74 | HR 76 | Ht 62.0 in | Wt 135.8 lb

## 2024-05-07 DIAGNOSIS — I10 Essential (primary) hypertension: Secondary | ICD-10-CM | POA: Diagnosis not present

## 2024-05-07 DIAGNOSIS — R519 Headache, unspecified: Secondary | ICD-10-CM | POA: Diagnosis not present

## 2024-05-07 DIAGNOSIS — H5711 Ocular pain, right eye: Secondary | ICD-10-CM | POA: Diagnosis not present

## 2024-05-07 DIAGNOSIS — G441 Vascular headache, not elsewhere classified: Secondary | ICD-10-CM | POA: Diagnosis not present

## 2024-05-07 DIAGNOSIS — E782 Mixed hyperlipidemia: Secondary | ICD-10-CM

## 2024-05-07 NOTE — Progress Notes (Signed)
 Established Patient Office Visit  Subjective:  Patient ID: Teresa Cunningham, female    DOB: 1951/03/04  Age: 73 y.o. MRN: [id number]  Chief Complaint  Patient presents with   Acute Visit    Sharp pains behind right eye    Patient comes in with 3-day history of sharp pains behind her right eye as well as right sided headaches.  She does not have a history of migraines.  Patient reports that the pain comes and lasts for several minutes.  She does not have any watering of her eyes and there is no visual disturbance in her right eye.  Denies photophobia.  No nausea or vomiting and no dizziness.  She does not have any neck muscle spasms.  Patient advised to get an appointment with her eye doctor.  Will also set up a CT scan of the head.    No other concerns at this time.   Past Medical History:  Diagnosis Date   Cervical cancer (HCC)    DVT (deep venous thrombosis) (HCC)    H/O DVT right leg   GERD (gastroesophageal reflux disease)    Hypercholesterolemia    Hypertension    controlled on meds   Swelling of lower extremity    Vertigo    Wears partial dentures    bottom    Past Surgical History:  Procedure Laterality Date   ABDOMINAL HYSTERECTOMY     BREAST EXCISIONAL BIOPSY Left 1996   neg   BREAST EXCISIONAL BIOPSY Left    neg   CATARACT EXTRACTION W/PHACO Left 10/11/2017   Procedure: CATARACT EXTRACTION PHACO AND INTRAOCULAR LENS PLACEMENT (IOC) LEFT ISTENT;  Surgeon: Rosa College, MD;  Location: [facility];  Service: Ophthalmology;  Laterality: Left;   CATARACT EXTRACTION W/PHACO Right 08/20/2019   Procedure: CATARACT EXTRACTION PHACO AND INTRAOCULAR LENS PLACEMENT (IOC)  ISTENT INJ RIGHT 00:25.2      8.4%       2.33;  Surgeon: Rosa College, MD;  Location: [facility];  Service: Ophthalmology;  Laterality: Right;   COLONOSCOPY     INSERTION OF ANTERIOR SEGMENT AQUEOUS DRAINAGE DEVICE (ISTENT) Right 08/20/2019   Procedure: INSERTION OF ANTERIOR  SEGMENT AQUEOUS DRAINAGE DEVICE (ISTENT);  Surgeon: Rosa College, MD;  Location: [facility];  Service: Ophthalmology;  Laterality: Right;   OOPHORECTOMY      Social History   Socioeconomic History   Marital status: Married    Spouse name: Not on file   Number of children: Not on file   Years of education: Not on file   Highest education level: Not on file  Occupational History   Not on file  Tobacco Use   Smoking status: Never   Smokeless tobacco: Never  Vaping Use   Vaping status: Never Used  Substance and Sexual Activity   Alcohol use: Never    Comment: occassional   Drug use: Never   Sexual activity: Not Currently  Other Topics Concern   Not on file  Social History Narrative   Not on file   Social Drivers of Health   Financial Resource Strain: Low Risk  (01/31/2024)   Received from [facility]   Overall Financial Resource Strain (CARDIA)    Difficulty of Paying Living Expenses: Not hard at all  Food Insecurity: No Food Insecurity (01/31/2024)   Received from [facility]   Hunger Vital Sign    Worried About Running Out of Food in the Last Year: Never true  Ran Out of Food in the Last Year: Never true  Transportation Needs: No Transportation Needs (01/31/2024)   Received from [facility] - Transportation    In the past 12 months, has lack of transportation kept you from medical appointments or from getting medications?: No    Lack of Transportation (Non-Medical): No  Physical Activity: Not on file  Stress: Not on file  Social Connections: Not on file  Intimate Partner Violence: Not on file    Family History  Problem Relation Age of Onset   Breast cancer Neg Hx     No Known Allergies  Outpatient Medications Prior to Visit  Medication Sig   amLODipine  (NORVASC ) 5 MG tablet Take 1 tablet (5 mg total) by mouth daily. am   atorvastatin  (LIPITOR) 10 MG tablet Take 1 tablet (10 mg total)  by mouth daily.   CALCIUM -VITAMIN D PO Take by mouth.   hydrochlorothiazide  (HYDRODIURIL ) 25 MG tablet Take 1 tablet (25 mg total) by mouth daily. am   Metoprolol  Succinate 50 MG CS24 Take 50 mg by mouth daily.   Multiple Vitamins-Minerals (MULTIPLE VITAMINS/WOMENS PO) Take 1 tablet by mouth daily.   pantoprazole  (PROTONIX ) 20 MG tablet Take 1 tablet (20 mg total) by mouth daily.   Vitamin D, Ergocalciferol, 50000 units CAPS Take 1 tablet by mouth daily.   No facility-administered medications prior to visit.    Review of Systems  Constitutional: Negative.  Negative for chills, fever, malaise/fatigue and weight loss.  HENT: Negative.  Negative for sore throat.   Eyes: Negative.   Respiratory: Negative.  Negative for cough and shortness of breath.   Cardiovascular: Negative.  Negative for chest pain, palpitations and leg swelling.  Gastrointestinal: Negative.  Negative for abdominal pain, constipation, diarrhea, heartburn, nausea and vomiting.  Genitourinary: Negative.  Negative for dysuria and flank pain.  Musculoskeletal: Negative.  Negative for joint pain and myalgias.  Skin: Negative.   Neurological:  Positive for headaches. Negative for dizziness, tingling, tremors, sensory change, speech change, focal weakness and weakness.  Endo/Heme/Allergies: Negative.   Psychiatric/Behavioral: Negative.  Negative for depression and suicidal ideas. The patient is not nervous/anxious.        Objective:   BP 114/74   Pulse 76   Ht 5\' 2"  (1.575 m)   Wt 135 lb 12.8 oz (61.6 kg)   SpO2 98%   BMI 24.84 kg/m   Vitals:   05/07/24 1426  BP: 114/74  Pulse: 76  Height: 5\' 2"  (1.575 m)  Weight: 135 lb 12.8 oz (61.6 kg)  SpO2: 98%  BMI (Calculated): 24.83    Physical Exam Vitals and nursing note reviewed.  Constitutional:      Appearance: Normal appearance.  HENT:     Head: Normocephalic and atraumatic.     Nose: Nose normal.     Mouth/Throat:     Mouth: Mucous membranes are moist.      Pharynx: Oropharynx is clear.  Eyes:     Conjunctiva/sclera: Conjunctivae normal.     Pupils: Pupils are equal, round, and reactive to light.  Cardiovascular:     Rate and Rhythm: Normal rate and regular rhythm.     Pulses: Normal pulses.     Heart sounds: Normal heart sounds. No murmur heard. Pulmonary:     Effort: Pulmonary effort is normal.     Breath sounds: Normal breath sounds. No wheezing.  Abdominal:     General: Bowel sounds are normal.     Palpations: Abdomen is  soft.     Tenderness: There is no abdominal tenderness. There is no right CVA tenderness or left CVA tenderness.  Musculoskeletal:        General: Normal range of motion.     Cervical back: Normal range of motion.     Right lower leg: No edema.     Left lower leg: No edema.  Skin:    General: Skin is warm and dry.  Neurological:     General: No focal deficit present.     Mental Status: She is alert and oriented to person, place, and time.  Psychiatric:        Mood and Affect: Mood normal.        Behavior: Behavior normal.      No results found for any visits on 05/07/24.  Recent Results (from the past 2160 hours)  Hemoglobin A1c     Status: Abnormal   Collection Time: 04/03/24  9:57 AM  Result Value Ref Range   Hgb A1c MFr Bld 6.2 (H) 4.8 - 5.6 %    Comment:          Prediabetes: 5.7 - 6.4          Diabetes: >6.4          Glycemic control for adults with diabetes: <7.0    Est. average glucose Bld gHb Est-mCnc 131 mg/dL  TSH     Status: None   Collection Time: 04/03/24  9:57 AM  Result Value Ref Range   TSH 1.600 0.450 - 4.500 uIU/mL  CMP14+EGFR     Status: None   Collection Time: 04/03/24  9:57 AM  Result Value Ref Range   Glucose 82 70 - 99 mg/dL   BUN 15 8 - 27 mg/dL   Creatinine, Ser 1.61 0.57 - 1.00 mg/dL   eGFR 69 >09 UE/AVW/0.98   BUN/Creatinine Ratio 17 12 - 28   Sodium 141 134 - 144 mmol/L   Potassium 3.7 3.5 - 5.2 mmol/L   Chloride 100 96 - 106 mmol/L   CO2 26 20 - 29 mmol/L    Calcium  9.9 8.7 - 10.3 mg/dL   Total Protein 7.0 6.0 - 8.5 g/dL   Albumin 4.0 3.8 - 4.8 g/dL   Globulin, Total 3.0 1.5 - 4.5 g/dL   Bilirubin Total 0.5 0.0 - 1.2 mg/dL   Alkaline Phosphatase 71 44 - 121 IU/L   AST 19 0 - 40 IU/L   ALT 14 0 - 32 IU/L  Lipid panel     Status: None   Collection Time: 04/03/24  9:57 AM  Result Value Ref Range   Cholesterol, Total 170 100 - 199 mg/dL   Triglycerides 44 0 - 149 mg/dL   HDL 71 >11 mg/dL   VLDL Cholesterol Cal 9 5 - 40 mg/dL   LDL Chol Calc (NIH) 90 0 - 99 mg/dL   Chol/HDL Ratio 2.4 0.0 - 4.4 ratio    Comment:                                   T. Chol/HDL Ratio                                             Men  Women  1/2 Avg.Risk  3.4    3.3                                   Avg.Risk  5.0    4.4                                2X Avg.Risk  9.6    7.1                                3X Avg.Risk 23.4   11.0       Assessment & Plan:  Schedule CT scan of the head.  Eye exam.  Continue current medications. Problem List Items Addressed This Visit     Essential hypertension, benign   Mixed hyperlipidemia   Other Visit Diagnoses       Right-sided headache    -  Primary   Relevant Orders   CT HEAD W & WO CONTRAST ( )   Ambulatory referral to Ophthalmology     Acute pain in right eye       Relevant Orders   CT HEAD W & WO CONTRAST ( )   Ambulatory referral to Ophthalmology     Other vascular headache       Relevant Orders   CT HEAD W & WO CONTRAST ( )       Return in about 10 days (around 05/17/2024).   Total time spent: 30 minutes  Aisha Hove, MD  05/07/2024   This document may have been prepared by [employer] [facility] [employer] Voice Recognition software and as such may include unintentional dictation errors.

## 2024-05-08 DIAGNOSIS — Z961 Presence of intraocular lens: Secondary | ICD-10-CM | POA: Diagnosis not present

## 2024-05-08 DIAGNOSIS — H401131 Primary open-angle glaucoma, bilateral, mild stage: Secondary | ICD-10-CM | POA: Diagnosis not present

## 2024-05-08 DIAGNOSIS — H04123 Dry eye syndrome of bilateral lacrimal glands: Secondary | ICD-10-CM | POA: Diagnosis not present

## 2024-05-18 ENCOUNTER — Ambulatory Visit: Admitting: Internal Medicine

## 2024-05-22 ENCOUNTER — Ambulatory Visit (INDEPENDENT_AMBULATORY_CARE_PROVIDER_SITE_OTHER): Admitting: Internal Medicine

## 2024-05-22 ENCOUNTER — Encounter: Payer: Self-pay | Admitting: Internal Medicine

## 2024-05-22 VITALS — BP 116/74 | HR 64 | Ht 62.0 in | Wt 136.4 lb

## 2024-05-22 DIAGNOSIS — R519 Headache, unspecified: Secondary | ICD-10-CM

## 2024-05-22 DIAGNOSIS — R7303 Prediabetes: Secondary | ICD-10-CM | POA: Diagnosis not present

## 2024-05-22 DIAGNOSIS — E782 Mixed hyperlipidemia: Secondary | ICD-10-CM

## 2024-05-22 DIAGNOSIS — Z13820 Encounter for screening for osteoporosis: Secondary | ICD-10-CM | POA: Diagnosis not present

## 2024-05-22 DIAGNOSIS — I10 Essential (primary) hypertension: Secondary | ICD-10-CM

## 2024-05-22 DIAGNOSIS — Z1231 Encounter for screening mammogram for malignant neoplasm of breast: Secondary | ICD-10-CM

## 2024-05-22 NOTE — Progress Notes (Signed)
 Established Patient Office Visit  Subjective:  Patient ID: Teresa Cunningham, female    DOB: [birth year]  Age: 73 y.o. MRN: [id number]  Chief Complaint  Patient presents with   Follow-up    10 day follow up    Patient comes in for her follow-up today.  She reports that her headaches is much better now but it still comes and goes.  She had an eye exam and was cleared.  She still waiting for the CT of the head.  Denies nausea or vomiting. Her labs were discussed.  Patient is due for a mammogram and a DEXA scan will schedule.    No other concerns at this time.   Past Medical History:  Diagnosis Date   Cervical cancer (HCC)    DVT (deep venous thrombosis) (HCC)    H/O DVT right leg   GERD (gastroesophageal reflux disease)    Hypercholesterolemia    Hypertension    controlled on meds   Swelling of lower extremity    Vertigo    Wears partial dentures    bottom    Past Surgical History:  Procedure Laterality Date   ABDOMINAL HYSTERECTOMY     BREAST EXCISIONAL BIOPSY Left 1996   neg   BREAST EXCISIONAL BIOPSY Left    neg   CATARACT EXTRACTION W/PHACO Left 10/11/2017   Procedure: CATARACT EXTRACTION PHACO AND INTRAOCULAR LENS PLACEMENT (IOC) LEFT ISTENT;  Surgeon: Myrna Adine Anes, MD;  Location: [facility];  Service: Ophthalmology;  Laterality: Left;   CATARACT EXTRACTION W/PHACO Right 08/20/2019   Procedure: CATARACT EXTRACTION PHACO AND INTRAOCULAR LENS PLACEMENT (IOC)  ISTENT INJ RIGHT 00:25.2      8.4%       2.33;  Surgeon: Myrna Adine Anes, MD;  Location: [facility];  Service: Ophthalmology;  Laterality: Right;   COLONOSCOPY     INSERTION OF ANTERIOR SEGMENT AQUEOUS DRAINAGE DEVICE (ISTENT) Right 08/20/2019   Procedure: INSERTION OF ANTERIOR SEGMENT AQUEOUS DRAINAGE DEVICE (ISTENT);  Surgeon: Myrna Adine Anes, MD;  Location: [facility];  Service: Ophthalmology;  Laterality: Right;   OOPHORECTOMY      Social History   Socioeconomic History    Marital status: Married    Spouse name: Not on file   Number of children: Not on file   Years of education: Not on file   Highest education level: Not on file  Occupational History   Not on file  Tobacco Use   Smoking status: Never   Smokeless tobacco: Never  Vaping Use   Vaping status: Never Used  Substance and Sexual Activity   Alcohol use: Never    Comment: occassional   Drug use: Never   Sexual activity: Not Currently  Other Topics Concern   Not on file  Social History Narrative   Not on file   Social Drivers of Health   Financial Resource Strain: Low Risk  (01/31/2024)   [facility]   Overall Financial Resource Strain (CARDIA)    Difficulty of Paying Living Expenses: Not hard at all  Food Insecurity: No Food Insecurity (01/31/2024)   Received from [facility]   Hunger Vital Sign    Within the past 12 months, you worried that your food would run out before you got the money to buy more.: Never true    Within the past 12 months, the food you bought just didn't last and you didn't have money to get more.: Never true  Transportation Needs: No Transportation  Needs (01/31/2024)   Received from [facility]    In the past 12 months, has lack of transportation kept you from medical appointments or from getting medications?: No    Lack of Transportation (Non-Medical): No  Physical Activity: Not on file  Stress: Not on file  Social Connections: Not on file  Intimate Partner Violence: Not on file    Family History  Problem Relation Age of Onset   Breast cancer Neg Hx     No Known Allergies  Outpatient Medications Prior to Visit  Medication Sig   amLODipine  (NORVASC ) 5 MG tablet Take 1 tablet (5 mg total) by mouth daily. am   atorvastatin  (LIPITOR) 10 MG tablet Take 1 tablet (10 mg total) by mouth daily.   CALCIUM -VITAMIN D PO Take by mouth.   hydrochlorothiazide  (HYDRODIURIL )  25 MG tablet Take 1 tablet (25 mg total) by mouth daily. am   Metoprolol  Succinate 50 MG CS24 Take 50 mg by mouth daily.   Multiple Vitamins-Minerals (MULTIPLE VITAMINS/WOMENS PO) Take 1 tablet by mouth daily.   pantoprazole  (PROTONIX ) 20 MG tablet Take 1 tablet (20 mg total) by mouth daily.   Vitamin D, Ergocalciferol, 50000 units CAPS Take 1 tablet by mouth daily.   No facility-administered medications prior to visit.    Review of Systems  Constitutional: Negative.  Negative for chills and fever.  HENT: Negative.  Negative for hearing loss and sore throat.   Eyes: Negative.   Respiratory: Negative.  Negative for cough and shortness of breath.   Cardiovascular: Negative.  Negative for chest pain, palpitations and leg swelling.  Gastrointestinal: Negative.  Negative for abdominal pain, constipation, diarrhea, heartburn, nausea and vomiting.  Genitourinary: Negative.  Negative for dysuria and flank pain.  Musculoskeletal: Negative.  Negative for joint pain and myalgias.  Skin: Negative.   Neurological:  Positive for headaches. Negative for dizziness, tingling and tremors.  Endo/Heme/Allergies: Negative.   Psychiatric/Behavioral: Negative.  Negative for depression and suicidal ideas. The patient is not nervous/anxious.        Objective:   BP 116/74   Pulse 64   Ht 5' 2 (1.575 m)   Wt 136 lb 6.4 oz (61.9 kg)   SpO2 98%   BMI 24.95 kg/m   Vitals:   05/22/24 0913  BP: 116/74  Pulse: 64  Height: 5' 2 (1.575 m)  Weight: 136 lb 6.4 oz (61.9 kg)  SpO2: 98%  BMI (Calculated): 24.94    Physical Exam Vitals and nursing note reviewed.  Constitutional:      Appearance: Normal appearance.  HENT:     Head: Normocephalic and atraumatic.     Nose: Nose normal.     Mouth/Throat:     Mouth: Mucous membranes are moist.     Pharynx: Oropharynx is clear.   Eyes:     Conjunctiva/sclera: Conjunctivae normal.     Pupils: Pupils are equal, round, and reactive to light.     Cardiovascular:     Rate and Rhythm: Normal rate and regular rhythm.     Pulses: Normal pulses.     Heart sounds: Normal heart sounds. No murmur heard. Pulmonary:     Effort: Pulmonary effort is normal.     Breath sounds: Normal breath sounds. No wheezing.  Abdominal:     General: Bowel sounds are normal.     Palpations: Abdomen is soft.     Tenderness: There is no abdominal tenderness. There is no right CVA tenderness or left  CVA tenderness.   Musculoskeletal:        General: Normal range of motion.     Cervical back: Normal range of motion.     Right lower leg: No edema.     Left lower leg: No edema.   Skin:    General: Skin is warm and dry.   Neurological:     General: No focal deficit present.     Mental Status: She is alert and oriented to person, place, and time.   Psychiatric:        Mood and Affect: Mood normal.        Behavior: Behavior normal.      No results found for any visits on 05/22/24.  Recent Results (from the past 2160 hours)  Hemoglobin A1c     Status: Abnormal   Collection Time: 04/03/24  9:57 AM  Result Value Ref Range   Hgb A1c MFr Bld 6.2 (H) 4.8 - 5.6 %    Comment:          Prediabetes: 5.7 - 6.4          Diabetes: >6.4          Glycemic control for adults with diabetes: <7.0    Est. average glucose Bld gHb Est-mCnc 131 mg/dL  TSH     Status: None   Collection Time: 04/03/24  9:57 AM  Result Value Ref Range   TSH 1.600 0.450 - 4.500 uIU/mL  CMP14+EGFR     Status: None   Collection Time: 04/03/24  9:57 AM  Result Value Ref Range   Glucose 82 70 - 99 mg/dL   BUN 15 8 - 27 mg/dL   Creatinine, Ser 9.10 0.57 - 1.00 mg/dL   eGFR 69 >40 fO/fpw/8.26   BUN/Creatinine Ratio 17 12 - 28   Sodium 141 134 - 144 mmol/L   Potassium 3.7 3.5 - 5.2 mmol/L   Chloride 100 96 - 106 mmol/L   CO2 26 20 - 29 mmol/L   Calcium  9.9 8.7 - 10.3 mg/dL   Total Protein 7.0 6.0 - 8.5 g/dL   Albumin 4.0 3.8 - 4.8 g/dL   Globulin, Total 3.0 1.5 - 4.5 g/dL    Bilirubin Total 0.5 0.0 - 1.2 mg/dL   Alkaline Phosphatase 71 44 - 121 IU/L   AST 19 0 - 40 IU/L   ALT 14 0 - 32 IU/L  Lipid panel     Status: None   Collection Time: 04/03/24  9:57 AM  Result Value Ref Range   Cholesterol, Total 170 100 - 199 mg/dL   Triglycerides 44 0 - 149 mg/dL   HDL 71 >60 mg/dL   VLDL Cholesterol Cal 9 5 - 40 mg/dL   LDL Chol Calc (NIH) 90 0 - 99 mg/dL   Chol/HDL Ratio 2.4 0.0 - 4.4 ratio    Comment:                                   T. Chol/HDL Ratio                                             Men  Women  1/2 Avg.Risk  3.4    3.3                                   Avg.Risk  5.0    4.4                                2X Avg.Risk  9.6    7.1                                3X Avg.Risk 23.4   11.0       Assessment & Plan:  Continue current medications.  Await CT head.  Will call her with results. Problem List Items Addressed This Visit     Prediabetes   Essential hypertension, benign - Primary   Mixed hyperlipidemia   Other Visit Diagnoses       Breast cancer screening by mammogram       Relevant Orders   MM 3D SCREENING MAMMOGRAM BILATERAL BREAST     Screening for osteoporosis       Relevant Orders   DG Bone Density     Right-sided headache           Return in about 6 weeks (around 07/03/2024).   Total time spent: 30 minutes  FERNAND FREDY RAMAN, MD  05/22/2024   This document may have been prepared by [facility] Voice Recognition software and as such may include unintentional dictation errors.

## 2024-05-25 ENCOUNTER — Ambulatory Visit
Admission: RE | Admit: 2024-05-25 | Discharge: 2024-05-25 | Disposition: A | Discharge status: Home / Self Care | Source: Ambulatory Visit | Attending: Internal Medicine | Admitting: Internal Medicine

## 2024-05-25 DIAGNOSIS — H5711 Ocular pain, right eye: Secondary | ICD-10-CM | POA: Diagnosis not present

## 2024-05-25 DIAGNOSIS — R519 Headache, unspecified: Secondary | ICD-10-CM | POA: Insufficient documentation

## 2024-05-25 DIAGNOSIS — G441 Vascular headache, not elsewhere classified: Secondary | ICD-10-CM | POA: Insufficient documentation

## 2024-05-25 MED ORDER — IOHEXOL 300 MG/ML  SOLN
75.0000 mL | Freq: Once | INTRAMUSCULAR | Status: AC | PRN
  Administered 2024-05-25: 75 mL via INTRAVENOUS

## 2024-06-04 ENCOUNTER — Ambulatory Visit: Payer: Self-pay | Admitting: Internal Medicine

## 2024-06-04 NOTE — Progress Notes (Signed)
 Patient notified

## 2024-07-03 ENCOUNTER — Ambulatory Visit: Admitting: Cardiology

## 2024-07-03 ENCOUNTER — Ambulatory Visit: Admitting: Internal Medicine

## 2024-07-17 ENCOUNTER — Encounter: Payer: Self-pay | Admitting: Internal Medicine

## 2024-07-17 ENCOUNTER — Ambulatory Visit (INDEPENDENT_AMBULATORY_CARE_PROVIDER_SITE_OTHER): Admitting: Internal Medicine

## 2024-07-17 VITALS — BP 108/72 | HR 63 | Ht 62.0 in | Wt 137.6 lb

## 2024-07-17 DIAGNOSIS — K219 Gastro-esophageal reflux disease without esophagitis: Secondary | ICD-10-CM

## 2024-07-17 DIAGNOSIS — I361 Nonrheumatic tricuspid (valve) insufficiency: Secondary | ICD-10-CM | POA: Diagnosis not present

## 2024-07-17 DIAGNOSIS — R7303 Prediabetes: Secondary | ICD-10-CM

## 2024-07-17 DIAGNOSIS — Z0130 Encounter for examination of blood pressure without abnormal findings: Secondary | ICD-10-CM

## 2024-07-17 MED ORDER — PANTOPRAZOLE SODIUM 40 MG PO TBEC: 40.0000 mg | DELAYED_RELEASE_TABLET | Freq: Every day | ORAL | 3 refills | Status: AC

## 2024-07-17 NOTE — Progress Notes (Signed)
 Established Patient Office Visit  Subjective:  Patient ID: Teresa Cunningham, female    DOB: [birth year]  Age: 73 y.o. MRN: [id number]  Chief Complaint  Patient presents with   Follow-up    6 week follow up    Patient comes in for her 6 week follow-up today.  She reports that her headaches is much better now but it still comes and goes. Reports when she has these headaches they are behind her eyes; she has an upcoming appointment for further testing with her ophthalmologist. Her CT of the head was normal.  Denies nausea or vomiting but reports frequent heartburn on current pantoprazole  20 mg daily. Her labs were discussed.  Patient is due for a mammogram and a DEXA scan that is scheduled for September, 2025.  Patient has a murmur that was last followed with an echo in 2012.   No other concerns at this time.   Past Medical History:  Diagnosis Date   Cervical cancer (HCC)    DVT (deep venous thrombosis) (HCC)    H/O DVT right leg   GERD (gastroesophageal reflux disease)    Hypercholesterolemia    Hypertension    controlled on meds   Swelling of lower extremity    Vertigo    Wears partial dentures    bottom    Past Surgical History:  Procedure Laterality Date   ABDOMINAL HYSTERECTOMY     BREAST EXCISIONAL BIOPSY Left 1996   neg   BREAST EXCISIONAL BIOPSY Left    neg   CATARACT EXTRACTION W/PHACO Left 10/11/2017   Procedure: CATARACT EXTRACTION PHACO AND INTRAOCULAR LENS PLACEMENT (IOC) LEFT ISTENT;  Surgeon: Myrna Adine Anes, MD;  Location: [facility];  Service: Ophthalmology;  Laterality: Left;   CATARACT EXTRACTION W/PHACO Right 08/20/2019   Procedure: CATARACT EXTRACTION PHACO AND INTRAOCULAR LENS PLACEMENT (IOC)  ISTENT INJ RIGHT 00:25.2      8.4%       2.33;  Surgeon: Myrna Adine Anes, MD;  Location: [facility];  Service: Ophthalmology;  Laterality: Right;   COLONOSCOPY     INSERTION OF ANTERIOR SEGMENT AQUEOUS DRAINAGE DEVICE (ISTENT) Right 08/20/2019    Procedure: INSERTION OF ANTERIOR SEGMENT AQUEOUS DRAINAGE DEVICE (ISTENT);  Surgeon: Myrna Adine Anes, MD;  Location: [facility];  Service: Ophthalmology;  Laterality: Right;   OOPHORECTOMY      Social History   Socioeconomic History   Marital status: Married    Spouse name: Not on file   Number of children: Not on file   Years of education: Not on file   Highest education level: Not on file  Occupational History   Not on file  Tobacco Use   Smoking status: Never   Smokeless tobacco: Never  Vaping Use   Vaping status: Never Used  Substance and Sexual Activity   Alcohol use: Never    Comment: occassional   Drug use: Never   Sexual activity: Not Currently  Other Topics Concern   Not on file  Social History Narrative   Not on file   Social Drivers of Health   Financial Resource Strain: Low Risk  (01/31/2024)   Received from [facility]   Overall Financial Resource Strain (CARDIA)    Difficulty of Paying Living Expenses: Not hard at all  Food Insecurity: No Food Insecurity (01/31/2024)   Received from [facility]   Hunger Vital Sign    Within the past 12 months, you worried that your food would run out before  you got the money to buy more.: Never true    Within the past 12 months, the food you bought just didn't last and you didn't have money to get more.: Never true  Transportation Needs: No Transportation Needs (01/31/2024)   Received from [facility] Transportation    In the past 12 months, has lack of transportation kept you from medical appointments or from getting medications?: No    Lack of Transportation (Non-Medical): No  Physical Activity: Not on file  Stress: Not on file  Social Connections: Not on file  Intimate Partner Violence: Not on file    Family History  Problem Relation Age of Onset   Breast cancer Neg Hx     No Known Allergies  Outpatient Medications Prior to Visit   Medication Sig   amLODipine  (NORVASC ) 5 MG tablet Take 1 tablet (5 mg total) by mouth daily. am   atorvastatin  (LIPITOR) 10 MG tablet Take 1 tablet (10 mg total) by mouth daily.   CALCIUM -VITAMIN D PO Take by mouth.   hydrochlorothiazide  (HYDRODIURIL ) 25 MG tablet Take 1 tablet (25 mg total) by mouth daily. am   Metoprolol  Succinate 50 MG CS24 Take 50 mg by mouth daily.   traMADol (ULTRAM) 50 MG tablet Take 50 mg by mouth 4 (four) times daily as needed.   Vitamin D, Ergocalciferol, 50000 units CAPS Take 1 tablet by mouth daily.   [DISCONTINUED] pantoprazole  (PROTONIX ) 20 MG tablet Take 1 tablet (20 mg total) by mouth daily.   Multiple Vitamins-Minerals (MULTIPLE VITAMINS/WOMENS PO) Take 1 tablet by mouth daily. (Patient not taking: Reported on 07/17/2024)   No facility-administered medications prior to visit.    Review of Systems  Constitutional: Negative.   HENT: Negative.    Eyes: Negative.   Respiratory: Negative.  Negative for cough and shortness of breath.   Cardiovascular: Negative.  Negative for chest pain, palpitations and leg swelling.  Gastrointestinal:  Positive for heartburn. Negative for abdominal pain, constipation, diarrhea, nausea and vomiting.  Genitourinary: Negative.  Negative for dysuria and flank pain.  Musculoskeletal: Negative.  Negative for joint pain and myalgias.  Skin: Negative.   Neurological: Negative.  Negative for dizziness and headaches.  Endo/Heme/Allergies: Negative.   Psychiatric/Behavioral: Negative.  Negative for depression and suicidal ideas. The patient is not nervous/anxious.      Objective:   BP 108/72   Pulse 63   Ht 5' 2 (1.575 m)   Wt 137 lb 9.6 oz (62.4 kg)   SpO2 97%   BMI 25.17 kg/m   Vitals:   07/17/24 1032  BP: 108/72  Pulse: 63  Height: 5' 2 (1.575 m)  Weight: 137 lb 9.6 oz (62.4 kg)  SpO2: 97%  BMI (Calculated): 25.16    Physical Exam Vitals and nursing note reviewed.  Constitutional:      Appearance: Normal  appearance.  HENT:     Head: Normocephalic and atraumatic.     Nose: Nose normal.     Mouth/Throat:     Mouth: Mucous membranes are moist.     Pharynx: Oropharynx is clear.  Eyes:     Conjunctiva/sclera: Conjunctivae normal.     Pupils: Pupils are equal, round, and reactive to light.  Cardiovascular:     Rate and Rhythm: Normal rate and regular rhythm.     Pulses: Normal pulses.     Heart sounds: Murmur heard.  Pulmonary:     Effort: Pulmonary effort is normal.     Breath sounds:  Normal breath sounds. No wheezing.  Abdominal:     General: Bowel sounds are normal.     Palpations: Abdomen is soft.     Tenderness: There is no abdominal tenderness. There is no right CVA tenderness or left CVA tenderness.  Musculoskeletal:        General: Normal range of motion.     Cervical back: Normal range of motion.     Right lower leg: No edema.     Left lower leg: No edema.  Skin:    General: Skin is warm and dry.  Neurological:     General: No focal deficit present.     Mental Status: She is alert and oriented to person, place, and time.  Psychiatric:        Mood and Affect: Mood normal.        Behavior: Behavior normal.    No results found for any visits on 07/17/24.  No results found for this or any previous visit (from the past 2160 hours).    Assessment & Plan:  Increase pantoprazole  from 20 mg to 40 mg once daily. FU in 3 months, if symptoms for GERD do not improve, suggest GI consult for endoscopy. Awaiting results for echo. Problem List Items Addressed This Visit   None Visit Diagnoses       Nonrheumatic tricuspid valve regurgitation    -  Primary   Relevant Orders   PCV ECHOCARDIOGRAM COMPLETE     Gastroesophageal reflux disease without esophagitis       Relevant Medications   pantoprazole  (PROTONIX ) 40 MG tablet       Return in about 3 months (around 10/17/2024).   Total time spent: 30 minutes  FERNAND FREDY RAMAN, MD  07/17/2024   This document may have been  prepared by [facility] Voice Recognition software and as such may include unintentional dictation errors.

## 2024-07-27 ENCOUNTER — Ambulatory Visit

## 2024-07-27 DIAGNOSIS — I340 Nonrheumatic mitral (valve) insufficiency: Secondary | ICD-10-CM | POA: Diagnosis not present

## 2024-07-27 DIAGNOSIS — I351 Nonrheumatic aortic (valve) insufficiency: Secondary | ICD-10-CM

## 2024-07-27 DIAGNOSIS — I361 Nonrheumatic tricuspid (valve) insufficiency: Secondary | ICD-10-CM | POA: Diagnosis not present

## 2024-07-27 DIAGNOSIS — I371 Nonrheumatic pulmonary valve insufficiency: Secondary | ICD-10-CM

## 2024-07-31 ENCOUNTER — Ambulatory Visit: Payer: Self-pay | Admitting: Internal Medicine

## 2024-07-31 ENCOUNTER — Ambulatory Visit
Admission: RE | Admit: 2024-07-31 | Discharge: 2024-07-31 | Disposition: A | Discharge status: Home / Self Care | Source: Ambulatory Visit | Attending: Internal Medicine | Admitting: Internal Medicine

## 2024-07-31 DIAGNOSIS — Z13820 Encounter for screening for osteoporosis: Secondary | ICD-10-CM | POA: Insufficient documentation

## 2024-07-31 DIAGNOSIS — Z1231 Encounter for screening mammogram for malignant neoplasm of breast: Secondary | ICD-10-CM | POA: Insufficient documentation

## 2024-07-31 DIAGNOSIS — Z780 Asymptomatic menopausal state: Secondary | ICD-10-CM | POA: Diagnosis not present

## 2024-08-02 ENCOUNTER — Ambulatory Visit: Payer: Self-pay | Admitting: Internal Medicine

## 2024-08-03 DIAGNOSIS — Z961 Presence of intraocular lens: Secondary | ICD-10-CM | POA: Diagnosis not present

## 2024-08-03 DIAGNOSIS — H401131 Primary open-angle glaucoma, bilateral, mild stage: Secondary | ICD-10-CM | POA: Diagnosis not present

## 2024-08-31 DIAGNOSIS — M7542 Impingement syndrome of left shoulder: Secondary | ICD-10-CM | POA: Diagnosis not present

## 2024-08-31 DIAGNOSIS — M546 Pain in thoracic spine: Secondary | ICD-10-CM | POA: Diagnosis not present

## 2024-09-17 DIAGNOSIS — M546 Pain in thoracic spine: Secondary | ICD-10-CM | POA: Diagnosis not present

## 2024-09-17 DIAGNOSIS — M25512 Pain in left shoulder: Secondary | ICD-10-CM | POA: Diagnosis not present

## 2024-10-08 DIAGNOSIS — M546 Pain in thoracic spine: Secondary | ICD-10-CM | POA: Diagnosis not present

## 2024-10-08 DIAGNOSIS — M25512 Pain in left shoulder: Secondary | ICD-10-CM | POA: Diagnosis not present

## 2024-10-11 ENCOUNTER — Ambulatory Visit: Admitting: Internal Medicine

## 2024-10-12 DIAGNOSIS — M25512 Pain in left shoulder: Secondary | ICD-10-CM | POA: Diagnosis not present

## 2024-10-15 DIAGNOSIS — M25512 Pain in left shoulder: Secondary | ICD-10-CM | POA: Diagnosis not present

## 2024-10-15 DIAGNOSIS — M546 Pain in thoracic spine: Secondary | ICD-10-CM | POA: Diagnosis not present

## 2024-10-16 ENCOUNTER — Other Ambulatory Visit: Payer: Self-pay | Admitting: Cardiology

## 2024-10-18 ENCOUNTER — Ambulatory Visit: Admitting: Internal Medicine

## 2024-10-24 ENCOUNTER — Other Ambulatory Visit

## 2024-10-24 DIAGNOSIS — E782 Mixed hyperlipidemia: Secondary | ICD-10-CM

## 2024-10-24 DIAGNOSIS — I10 Essential (primary) hypertension: Secondary | ICD-10-CM

## 2024-10-24 DIAGNOSIS — R7303 Prediabetes: Secondary | ICD-10-CM

## 2024-10-26 ENCOUNTER — Ambulatory Visit: Payer: Self-pay | Admitting: Internal Medicine

## 2024-10-26 LAB — LIPID PANEL
Chol/HDL Ratio: 2.5 ratio (ref 0.0–4.4)
Cholesterol, Total: 143 mg/dL (ref 100–199)
HDL: 58 mg/dL (ref >39)
LDL Chol Calc (NIH): 74 mg/dL (ref 0–99)
Triglycerides: 47 mg/dL (ref 0–149)
VLDL Cholesterol Cal: 11 mg/dL (ref 5–40)

## 2024-10-26 LAB — HEMOGLOBIN A1C
Est. average glucose Bld gHb Est-mCnc: 120 mg/dL
Hgb A1c MFr Bld: 5.8 % — ABNORMAL HIGH (ref 4.8–5.6)

## 2024-10-26 LAB — CMP14+EGFR
ALT: 9 IU/L (ref 0–32)
AST: 21 IU/L (ref 0–40)
Albumin: 4.1 g/dL (ref 3.8–4.8)
Alkaline Phosphatase: 63 IU/L (ref 49–135)
BUN/Creatinine Ratio: 19 (ref 12–28)
BUN: 18 mg/dL (ref 8–27)
Bilirubin Total: 0.6 mg/dL (ref 0.0–1.2)
CO2: 27 mmol/L (ref 20–29)
Calcium: 8.7 mg/dL (ref 8.7–10.3)
Chloride: 100 mmol/L (ref 96–106)
Creatinine, Ser: 0.95 mg/dL (ref 0.57–1.00)
Globulin, Total: 2.4 g/dL (ref 1.5–4.5)
Glucose: 93 mg/dL (ref 70–99)
Potassium: 3.4 mmol/L — ABNORMAL LOW (ref 3.5–5.2)
Sodium: 141 mmol/L (ref 134–144)
Total Protein: 6.5 g/dL (ref 6.0–8.5)
eGFR: 64 mL/min/1.73 (ref >59)

## 2024-10-26 LAB — TSH: TSH: 1.56 u[IU]/mL (ref 0.450–4.500)

## 2024-10-30 ENCOUNTER — Encounter: Payer: Self-pay | Admitting: Internal Medicine

## 2024-10-30 ENCOUNTER — Ambulatory Visit: Admitting: Internal Medicine

## 2024-10-30 VITALS — BP 120/78 | HR 77 | Ht 62.0 in | Wt 138.6 lb

## 2024-10-30 DIAGNOSIS — E663 Overweight: Secondary | ICD-10-CM | POA: Diagnosis not present

## 2024-10-30 DIAGNOSIS — E559 Vitamin D deficiency, unspecified: Secondary | ICD-10-CM | POA: Diagnosis not present

## 2024-10-30 DIAGNOSIS — E876 Hypokalemia: Secondary | ICD-10-CM | POA: Insufficient documentation

## 2024-10-30 DIAGNOSIS — I10 Essential (primary) hypertension: Secondary | ICD-10-CM | POA: Diagnosis not present

## 2024-10-30 DIAGNOSIS — R7303 Prediabetes: Secondary | ICD-10-CM

## 2024-10-30 DIAGNOSIS — N760 Acute vaginitis: Secondary | ICD-10-CM | POA: Diagnosis not present

## 2024-10-30 DIAGNOSIS — E782 Mixed hyperlipidemia: Secondary | ICD-10-CM | POA: Diagnosis not present

## 2024-10-30 DIAGNOSIS — Z6825 Body mass index (BMI) 25.0-25.9, adult: Secondary | ICD-10-CM

## 2024-10-30 NOTE — Progress Notes (Signed)
 Established Patient Office Visit  Subjective:  Patient ID: Teresa Cunningham, female    DOB: 26-Oct-1951  Age: 73 y.o. MRN: [id number]  Chief Complaint  Patient presents with   Follow-up    3 month follow up    Patient is here today for follow up. She reports feeling well but has a new concern today.  Patient reports vaginal itching and malodor that began 2 days ago. Will collect Nuswab today. She does endorse leg cramps that are worse at bedtime. Her potassium was low on recent blood work. Will check BMP today and start potassium supplement if needed.  Patient reports her heart burn has improved with omeprazole dose increase. Patient reports ophthalmologist found no abnormal findings when she went for eye exam; She also states er headaches have since resolved.  Discussed Echo results with patient in detail. She denies any chest pain, shortness of breath, palpitations at this time.  Due for medicare annual wellness visit.      No other concerns at this time.   Past Medical History:  Diagnosis Date   Cervical cancer (HCC)    DVT (deep venous thrombosis) (HCC)    H/O DVT right leg   GERD (gastroesophageal reflux disease)    Hypercholesterolemia    Hypertension    controlled on meds   Swelling of lower extremity    Vertigo    Wears partial dentures    bottom    Past Surgical History:  Procedure Laterality Date   ABDOMINAL HYSTERECTOMY     BREAST EXCISIONAL BIOPSY Left 1996   neg   BREAST EXCISIONAL BIOPSY Left    neg   CATARACT EXTRACTION W/PHACO Left 10/11/2017   Procedure: CATARACT EXTRACTION PHACO AND INTRAOCULAR LENS PLACEMENT (IOC) LEFT ISTENT;  Surgeon: Myrna Adine Anes, MD;  Location: [facility];  Service: Ophthalmology;  Laterality: Left;   CATARACT EXTRACTION W/PHACO Right 08/20/2019   Procedure: CATARACT EXTRACTION PHACO AND INTRAOCULAR LENS PLACEMENT (IOC)  ISTENT INJ RIGHT 00:25.2      8.4%       2.33;  Surgeon: Myrna Adine Anes, MD;  Location:  [facility];  Service: Ophthalmology;  Laterality: Right;   COLONOSCOPY     INSERTION OF ANTERIOR SEGMENT AQUEOUS DRAINAGE DEVICE (ISTENT) Right 08/20/2019   Procedure: INSERTION OF ANTERIOR SEGMENT AQUEOUS DRAINAGE DEVICE (ISTENT);  Surgeon: Myrna Adine Anes, MD;  Location: [facility];  Service: Ophthalmology;  Laterality: Right;   OOPHORECTOMY      Social History   Socioeconomic History   Marital status: Married    Spouse name: Not on file   Number of children: Not on file   Years of education: Not on file   Highest education level: Not on file  Occupational History   Not on file  Tobacco Use   Smoking status: Never   Smokeless tobacco: Never  Vaping Use   Vaping status: Never Used  Substance and Sexual Activity   Alcohol use: Never    Comment: occassional   Drug use: Never   Sexual activity: Not Currently  Other Topics Concern   Not on file  Social History Narrative   Not on file   Social Drivers of Health   Financial Resource Strain: Low Risk  (01/31/2024)   Received from [facility]   Overall Financial Resource Strain (CARDIA)    Difficulty of Paying Living Expenses: Not hard at all  Food Insecurity: No Food Insecurity (01/31/2024)   Received from [facility]  Hunger Vital Sign    Within the past 12 months, you worried that your food would run out before you got the money to buy more.: Never true    Within the past 12 months, the food you bought just didn't last and you didn't have money to get more.: Never true  Transportation Needs: No Transportation Needs (01/31/2024)   Received from [facility] - Transportation    In the past 12 months, has lack of transportation kept you from medical appointments or from getting medications?: No    Lack of Transportation (Non-Medical): No  Physical Activity: Not on file  Stress: Not on file  Social Connections: Not on file  Intimate Partner  Violence: Not on file    Family History  Problem Relation Age of Onset   Breast cancer Neg Hx     No Known Allergies  Outpatient Medications Prior to Visit  Medication Sig   amLODipine  (NORVASC ) 5 MG tablet Take 1 tablet (5 mg total) by mouth daily. am   atorvastatin  (LIPITOR) 10 MG tablet TAKE ONE TABLET BY MOUTH ONCE DAILY   CALCIUM -VITAMIN D PO Take by mouth.   hydrochlorothiazide  (HYDRODIURIL ) 25 MG tablet Take 1 tablet (25 mg total) by mouth daily. am   metoprolol  succinate (TOPROL -XL) 50 MG 24 hr tablet Take 50 mg by mouth daily.   Metoprolol  Succinate 50 MG CS24 Take 50 mg by mouth daily.   pantoprazole  (PROTONIX ) 40 MG tablet Take 1 tablet (40 mg total) by mouth daily.   tiZANidine (ZANAFLEX) 2 MG tablet Take 2 mg by mouth at bedtime.   traMADol (ULTRAM) 50 MG tablet Take 50 mg by mouth 4 (four) times daily as needed.   traMADol (ULTRAM) 50 MG tablet Take 50 mg by mouth every 6 (six) hours.   Vitamin D, Ergocalciferol, 50000 units CAPS Take 1 tablet by mouth daily.   Multiple Vitamins-Minerals (MULTIPLE VITAMINS/WOMENS PO) Take 1 tablet by mouth daily. (Patient not taking: Reported on 10/30/2024)   No facility-administered medications prior to visit.    Review of Systems  Constitutional: Negative.  Negative for chills, fever and malaise/fatigue.  HENT: Negative.  Negative for congestion and sore throat.   Eyes: Negative.  Negative for blurred vision and pain.  Respiratory: Negative.  Negative for cough and shortness of breath.   Cardiovascular: Negative.  Negative for chest pain, palpitations and leg swelling.  Gastrointestinal: Negative.  Negative for abdominal pain, blood in stool, constipation, diarrhea, heartburn, melena, nausea and vomiting.  Genitourinary: Negative.  Negative for dysuria, flank pain, frequency and urgency.  Musculoskeletal:  Positive for myalgias (lower leg cramps). Negative for joint pain.  Skin: Negative.   Neurological: Negative.  Negative for  dizziness, tingling, sensory change, weakness and headaches.  Endo/Heme/Allergies: Negative.   Psychiatric/Behavioral: Negative.  Negative for depression and suicidal ideas. The patient is not nervous/anxious.        Objective:   BP 120/78   Pulse 77   Ht 5' 2 (1.575 m)   Wt 138 lb 9.6 oz (62.9 kg)   SpO2 95%   BMI 25.35 kg/m   Vitals:   10/30/24 1031  BP: 120/78  Pulse: 77  Height: 5' 2 (1.575 m)  Weight: 138 lb 9.6 oz (62.9 kg)  SpO2: 95%  BMI (Calculated): 25.34    Physical Exam Vitals and nursing note reviewed.  Constitutional:      Appearance: Normal appearance.  HENT:     Head: Normocephalic and atraumatic.  Nose: Nose normal.     Mouth/Throat:     Mouth: Mucous membranes are moist.     Pharynx: Oropharynx is clear.  Eyes:     Conjunctiva/sclera: Conjunctivae normal.     Pupils: Pupils are equal, round, and reactive to light.  Cardiovascular:     Rate and Rhythm: Normal rate and regular rhythm.     Pulses: Normal pulses.     Heart sounds: Murmur heard.  Pulmonary:     Effort: Pulmonary effort is normal.     Breath sounds: Normal breath sounds. No wheezing.  Abdominal:     General: Bowel sounds are normal.     Palpations: Abdomen is soft.     Tenderness: There is no abdominal tenderness. There is no right CVA tenderness or left CVA tenderness.  Musculoskeletal:        General: Normal range of motion.     Cervical back: Normal range of motion.     Right lower leg: No edema.     Left lower leg: No edema.  Skin:    General: Skin is warm and dry.  Neurological:     General: No focal deficit present.     Mental Status: She is alert and oriented to person, place, and time.  Psychiatric:        Mood and Affect: Mood normal.        Behavior: Behavior normal.      No results found for any visits on 10/30/24.  Recent Results (from the past 2160 hours)  CMP14+EGFR     Status: Abnormal   Collection Time: 10/24/24  9:07 AM  Result Value Ref Range    Glucose 93 70 - 99 mg/dL   BUN 18 8 - 27 mg/dL   Creatinine, Ser 9.04 0.57 - 1.00 mg/dL   eGFR 64 >40 fO/fpw/8.26   BUN/Creatinine Ratio 19 12 - 28   Sodium 141 134 - 144 mmol/L   Potassium 3.4 (L) 3.5 - 5.2 mmol/L   Chloride 100 96 - 106 mmol/L   CO2 27 20 - 29 mmol/L   Calcium  8.7 8.7 - 10.3 mg/dL   Total Protein 6.5 6.0 - 8.5 g/dL   Albumin 4.1 3.8 - 4.8 g/dL   Globulin, Total 2.4 1.5 - 4.5 g/dL   Bilirubin Total 0.6 0.0 - 1.2 mg/dL   Alkaline Phosphatase 63 49 - 135 IU/L   AST 21 0 - 40 IU/L   ALT 9 0 - 32 IU/L  Lipid panel     Status: None   Collection Time: 10/24/24  9:07 AM  Result Value Ref Range   Cholesterol, Total 143 100 - 199 mg/dL   Triglycerides 47 0 - 149 mg/dL   HDL 58 >60 mg/dL   VLDL Cholesterol Cal 11 5 - 40 mg/dL   LDL Chol Calc (NIH) 74 0 - 99 mg/dL   Chol/HDL Ratio 2.5 0.0 - 4.4 ratio    Comment:                                   T. Chol/HDL Ratio                                             Men  Women  1/2 Avg.Risk  3.4    3.3                                   Avg.Risk  5.0    4.4                                2X Avg.Risk  9.6    7.1                                3X Avg.Risk 23.4   11.0   Hemoglobin A1c     Status: Abnormal   Collection Time: 10/24/24  9:07 AM  Result Value Ref Range   Hgb A1c MFr Bld 5.8 (H) 4.8 - 5.6 %    Comment:          Prediabetes: 5.7 - 6.4          Diabetes: >6.4          Glycemic control for adults with diabetes: <7.0    Est. average glucose Bld gHb Est-mCnc 120 mg/dL  TSH     Status: None   Collection Time: 10/24/24  9:07 AM  Result Value Ref Range   TSH 1.560 0.450 - 4.500 uIU/mL      Assessment & Plan:  Continue taking medications as prescribed. Check BMP today and replace potassium as needed. Check Nuswab today. Reinforced healthy diet and exercise as tolerated. Patient due for AWV Problem List Items Addressed This Visit     Prediabetes   Essential hypertension, benign -  Primary   Relevant Orders   Basic metabolic panel with GFR   Mixed hyperlipidemia   Vitamin D deficiency   Relevant Orders   Vitamin D (25 hydroxy)   Acute vaginitis   Relevant Orders   NuSwab Vaginitis Plus (VG+)   Hypokalemia   Relevant Orders   Basic metabolic panel with GFR   Overweight with body mass index (BMI) of 25 to 25.9 in adult    Return in about 4 weeks (around 11/27/2024) for AWV.   Total time spent: 25 minutes. This time includes review of previous notes and results and patient face to face interaction during today's visit.    FERNAND FREDY RAMAN, MD  10/30/2024   This document may have been prepared by [facility] Voice Recognition software and as such may include unintentional dictation errors.

## 2024-10-31 ENCOUNTER — Ambulatory Visit: Payer: Self-pay | Admitting: Internal Medicine

## 2024-10-31 DIAGNOSIS — B9689 Other specified bacterial agents as the cause of diseases classified elsewhere: Secondary | ICD-10-CM

## 2024-10-31 DIAGNOSIS — E876 Hypokalemia: Secondary | ICD-10-CM

## 2024-10-31 LAB — BASIC METABOLIC PANEL WITH GFR
BUN/Creatinine Ratio: 19 (ref 12–28)
BUN: 15 mg/dL (ref 8–27)
CO2: 27 mmol/L (ref 20–29)
Calcium: 10 mg/dL (ref 8.7–10.3)
Chloride: 97 mmol/L (ref 96–106)
Creatinine, Ser: 0.81 mg/dL (ref 0.57–1.00)
Glucose: 90 mg/dL (ref 70–99)
Potassium: 3.4 mmol/L — ABNORMAL LOW (ref 3.5–5.2)
Sodium: 142 mmol/L (ref 134–144)
eGFR: 78 mL/min/1.73 (ref >59)

## 2024-10-31 LAB — VITAMIN D 25 HYDROXY (VIT D DEFICIENCY, FRACTURES): Vit D, 25-Hydroxy: 140 ng/mL — ABNORMAL HIGH (ref 30.0–100.0)

## 2024-10-31 MED ORDER — POTASSIUM CHLORIDE CRYS ER 10 MEQ PO TBCR: EXTENDED_RELEASE_TABLET | ORAL | 2 refills | Status: AC

## 2024-11-01 LAB — NUSWAB VAGINITIS PLUS (VG+)
Atopobium vaginae: HIGH {score} — AB
Candida albicans, NAA: NEGATIVE
Candida glabrata, NAA: NEGATIVE
Chlamydia trachomatis, NAA: NEGATIVE
Neisseria gonorrhoeae, NAA: NEGATIVE
Trich vag by NAA: NEGATIVE

## 2024-11-01 NOTE — Progress Notes (Signed)
 Patient notified

## 2024-11-02 MED ORDER — METRONIDAZOLE 500 MG PO TABS: 500.0000 mg | ORAL_TABLET | Freq: Two times a day (BID) | ORAL | 0 refills | Status: AC

## 2024-11-02 NOTE — Progress Notes (Signed)
 Patient notified

## 2024-11-27 ENCOUNTER — Ambulatory Visit (INDEPENDENT_AMBULATORY_CARE_PROVIDER_SITE_OTHER): Admitting: Internal Medicine

## 2024-11-27 ENCOUNTER — Encounter: Payer: Self-pay | Admitting: Internal Medicine

## 2024-11-27 VITALS — BP 126/76 | HR 77 | Ht 62.0 in | Wt 135.8 lb

## 2024-11-27 DIAGNOSIS — Z0000 Encounter for general adult medical examination without abnormal findings: Secondary | ICD-10-CM

## 2024-11-27 DIAGNOSIS — E782 Mixed hyperlipidemia: Secondary | ICD-10-CM | POA: Diagnosis not present

## 2024-11-27 DIAGNOSIS — I10 Essential (primary) hypertension: Secondary | ICD-10-CM | POA: Diagnosis not present

## 2024-11-27 DIAGNOSIS — E876 Hypokalemia: Secondary | ICD-10-CM | POA: Diagnosis not present

## 2024-11-27 DIAGNOSIS — N760 Acute vaginitis: Secondary | ICD-10-CM

## 2024-11-27 DIAGNOSIS — Z0001 Encounter for general adult medical examination with abnormal findings: Secondary | ICD-10-CM | POA: Diagnosis not present

## 2024-11-27 DIAGNOSIS — B9689 Other specified bacterial agents as the cause of diseases classified elsewhere: Secondary | ICD-10-CM

## 2024-11-27 DIAGNOSIS — R7303 Prediabetes: Secondary | ICD-10-CM | POA: Diagnosis not present

## 2024-11-27 NOTE — Progress Notes (Signed)
 "  Established Patient Office Visit  Subjective:  Patient ID: Teresa Cunningham, female    DOB: 15-Dec-1950  Age: 73 y.o. MRN: [id number]  Chief Complaint  Patient presents with   Follow-up    4 week follow up    Patient comes in for her AWV today.  She is generally feeling well and has no new complaints.  She has taken potassium supplement for hypokalemia, will check her potassium again today.  She has also completed her course of Flagyl  for BV.  Although she does not have any further discharge or irritation but still has noticed some odor.  Patient advised to start refresh, a probiotic for vaginal health. She continues to have left shoulder and upper back discomfort.  She has been evaluated by the orthopedic who have mentioned possible tear in the rotator cuff.  At her next visit she will be scheduled for MRI.  Patient encouraged to make an appointment with them for further evaluation and treatment. She is up-to-date on her mammogram, DEXA and colonoscopy. Her PHQ-9/GAD-7 score is 0/0. Her CAT score is 0.    No other concerns at this time.   Past Medical History:  Diagnosis Date   Cervical cancer (HCC)    DVT (deep venous thrombosis) (HCC)    H/O DVT right leg   GERD (gastroesophageal reflux disease)    Hypercholesterolemia    Hypertension    controlled on meds   Swelling of lower extremity    Vertigo    Wears partial dentures    bottom    Past Surgical History:  Procedure Laterality Date   ABDOMINAL HYSTERECTOMY     BREAST EXCISIONAL BIOPSY Left 1996   neg   BREAST EXCISIONAL BIOPSY Left    neg   CATARACT EXTRACTION W/PHACO Left 10/11/2017   Procedure: CATARACT EXTRACTION PHACO AND INTRAOCULAR LENS PLACEMENT (IOC) LEFT ISTENT;  Surgeon: Myrna Adine Anes, MD;  Location: [facility];  Service: Ophthalmology;  Laterality: Left;   CATARACT EXTRACTION W/PHACO Right 08/20/2019   Procedure: CATARACT EXTRACTION PHACO AND INTRAOCULAR LENS PLACEMENT (IOC)  ISTENT INJ RIGHT  00:25.2      8.4%       2.33;  Surgeon: Myrna Adine Anes, MD;  Location: [facility];  Service: Ophthalmology;  Laterality: Right;   COLONOSCOPY     INSERTION OF ANTERIOR SEGMENT AQUEOUS DRAINAGE DEVICE (ISTENT) Right 08/20/2019   Procedure: INSERTION OF ANTERIOR SEGMENT AQUEOUS DRAINAGE DEVICE (ISTENT);  Surgeon: Myrna Adine Anes, MD;  Location: [facility];  Service: Ophthalmology;  Laterality: Right;   OOPHORECTOMY      Social History   Socioeconomic History   Marital status: Married    Spouse name: Not on file   Number of children: Not on file   Years of education: Not on file   Highest education level: Not on file  Occupational History   Not on file  Tobacco Use   Smoking status: Never   Smokeless tobacco: Never  Vaping Use   Vaping status: Never Used  Substance and Sexual Activity   Alcohol use: Never    Comment: occassional   Drug use: Never   Sexual activity: Not Currently  Other Topics Concern   Not on file  Social History Narrative   Not on file   Social Drivers of Health   Tobacco Use: Low Risk (11/27/2024)   Patient History    Smoking Tobacco Use: Never    Smokeless Tobacco Use: Never    Passive Exposure: Not on file  Financial Resource Strain: Low Risk  (01/31/2024)   Received from [facility]   Overall Financial Resource Strain (CARDIA)    Difficulty of Paying Living Expenses: Not hard at all  Food Insecurity: No Food Insecurity (01/31/2024)   Received from [facility]   Epic    Within the past 12 months, you worried that your food would run out before you got the money to buy more.: Never true    Within the past 12 months, the food you bought just didn't last and you didn't have money to get more.: Never true  Transportation Needs: No Transportation Needs (01/31/2024)   Received from [facility] - Transportation    In the past 12 months, has lack of transportation kept you  from medical appointments or from getting medications?: No    Lack of Transportation (Non-Medical): No  Physical Activity: Not on file  Stress: Not on file  Social Connections: Not on file  Intimate Partner Violence: Not on file  Depression (PHQ2-9): Low Risk (11/27/2024)   Depression (PHQ2-9)    PHQ-2 Score: 0  Alcohol Screen: Not on file  Housing: Low Risk  (01/31/2024)   Received from [facility]   Epic    In the last 12 months, was there a time when you were not able to pay the mortgage or rent on time?: No    In the past 12 months, how many times have you moved where you were living?: 0    At any time in the past 12 months, were you homeless or living in a shelter (including now)?: No  Utilities: Not At Risk (01/31/2024)   Received from [facility] Utilities    Threatened with loss of utilities: No  Health Literacy: Not on file    Family History  Problem Relation Age of Onset   Breast cancer Neg Hx     Allergies[1]  Show/hide medication list[2]  Review of Systems  Constitutional: Negative.  Negative for chills, fever and malaise/fatigue.  HENT: Negative.  Negative for congestion and sore throat.   Eyes: Negative.  Negative for blurred vision and pain.  Respiratory: Negative.  Negative for cough and shortness of breath.   Cardiovascular: Negative.  Negative for chest pain, palpitations and leg swelling.  Gastrointestinal: Negative.  Negative for abdominal pain, blood in stool, constipation, diarrhea, heartburn, melena, nausea and vomiting.  Genitourinary: Negative.  Negative for dysuria, flank pain, frequency and urgency.  Musculoskeletal:  Positive for joint pain (Left shoulder). Negative for myalgias.  Skin: Negative.   Neurological: Negative.  Negative for dizziness, tingling, sensory change, weakness and headaches.  Endo/Heme/Allergies: Negative.   Psychiatric/Behavioral: Negative.  Negative for depression and suicidal ideas.  The patient is not nervous/anxious.        Objective:   BP 126/76   Pulse 77   Ht 5' 2 (1.575 m)   Wt 135 lb 12.8 oz (61.6 kg)   SpO2 93%   BMI 24.84 kg/m   Vitals:   11/27/24 1259  BP: 126/76  Pulse: 77  Height: 5' 2 (1.575 m)  Weight: 135 lb 12.8 oz (61.6 kg)  SpO2: 93%  BMI (Calculated): 24.83    Physical Exam Vitals and nursing note reviewed.  Constitutional:      Appearance: Normal appearance.  HENT:     Head: Normocephalic and atraumatic.     Nose: Nose normal.     Mouth/Throat:  Mouth: Mucous membranes are moist.     Pharynx: Oropharynx is clear.  Eyes:     Conjunctiva/sclera: Conjunctivae normal.     Pupils: Pupils are equal, round, and reactive to light.  Cardiovascular:     Rate and Rhythm: Normal rate and regular rhythm.     Pulses: Normal pulses.     Heart sounds: Normal heart sounds. No murmur heard. Pulmonary:     Effort: Pulmonary effort is normal.     Breath sounds: Normal breath sounds. No wheezing.  Abdominal:     General: Bowel sounds are normal.     Palpations: Abdomen is soft.     Tenderness: There is no abdominal tenderness. There is no right CVA tenderness or left CVA tenderness.  Musculoskeletal:        General: Normal range of motion.     Cervical back: Normal range of motion.     Right lower leg: No edema.     Left lower leg: No edema.  Skin:    General: Skin is warm and dry.  Neurological:     General: No focal deficit present.     Mental Status: She is alert and oriented to person, place, and time.  Psychiatric:        Mood and Affect: Mood normal.        Behavior: Behavior normal.      No results found for any visits on 11/27/24.  Recent Results (from the past 2160 hours)  CMP14+EGFR     Status: Abnormal   Collection Time: 10/24/24  9:07 AM  Result Value Ref Range   Glucose 93 70 - 99 mg/dL   BUN 18 8 - 27 mg/dL   Creatinine, Ser 9.04 0.57 - 1.00 mg/dL   eGFR 64 >40 fO/fpw/8.26   BUN/Creatinine Ratio 19 12 -  28   Sodium 141 134 - 144 mmol/L   Potassium 3.4 (L) 3.5 - 5.2 mmol/L   Chloride 100 96 - 106 mmol/L   CO2 27 20 - 29 mmol/L   Calcium  8.7 8.7 - 10.3 mg/dL   Total Protein 6.5 6.0 - 8.5 g/dL   Albumin 4.1 3.8 - 4.8 g/dL   Globulin, Total 2.4 1.5 - 4.5 g/dL   Bilirubin Total 0.6 0.0 - 1.2 mg/dL   Alkaline Phosphatase 63 49 - 135 IU/L   AST 21 0 - 40 IU/L   ALT 9 0 - 32 IU/L  Lipid panel     Status: None   Collection Time: 10/24/24  9:07 AM  Result Value Ref Range   Cholesterol, Total 143 100 - 199 mg/dL   Triglycerides 47 0 - 149 mg/dL   HDL 58 >60 mg/dL   VLDL Cholesterol Cal 11 5 - 40 mg/dL   LDL Chol Calc (NIH) 74 0 - 99 mg/dL   Chol/HDL Ratio 2.5 0.0 - 4.4 ratio    Comment:                                   T. Chol/HDL Ratio                                             Men  Women  1/2 Avg.Risk  3.4    3.3                                   Avg.Risk  5.0    4.4                                2X Avg.Risk  9.6    7.1                                3X Avg.Risk 23.4   11.0   Hemoglobin A1c     Status: Abnormal   Collection Time: 10/24/24  9:07 AM  Result Value Ref Range   Hgb A1c MFr Bld 5.8 (H) 4.8 - 5.6 %    Comment:          Prediabetes: 5.7 - 6.4          Diabetes: >6.4          Glycemic control for adults with diabetes: <7.0    Est. average glucose Bld gHb Est-mCnc 120 mg/dL  TSH     Status: None   Collection Time: 10/24/24  9:07 AM  Result Value Ref Range   TSH 1.560 0.450 - 4.500 uIU/mL  Basic metabolic panel with GFR     Status: Abnormal   Collection Time: 10/30/24 11:06 AM  Result Value Ref Range   Glucose 90 70 - 99 mg/dL   BUN 15 8 - 27 mg/dL   Creatinine, Ser 9.18 0.57 - 1.00 mg/dL   eGFR 78 >40 fO/fpw/8.26   BUN/Creatinine Ratio 19 12 - 28   Sodium 142 134 - 144 mmol/L   Potassium 3.4 (L) 3.5 - 5.2 mmol/L   Chloride 97 96 - 106 mmol/L   CO2 27 20 - 29 mmol/L   Calcium  10.0 8.7 - 10.3 mg/dL  Vitamin D  (25 hydroxy)     Status:  Abnormal   Collection Time: 10/30/24 11:06 AM  Result Value Ref Range   Vit D, 25-Hydroxy 140.0 (H) 30.0 - 100.0 ng/mL    Comment: Vitamin D  deficiency has been defined by the Institute of Medicine and an Endocrine Society practice guideline as a level of serum 25-OH vitamin D  less than 20 ng/mL (1,2). The Endocrine Society went on to further define vitamin D  insufficiency as a level between 21 and 29 ng/mL (2). 1. IOM (Institute of Medicine). 2010. Dietary reference    intakes for calcium  and D. [place]: The    [employer]. 2. Holick MF, Binkley Mineral, Bischoff-Ferrari HA, et al.    Evaluation, treatment, and prevention of vitamin D     deficiency: an Endocrine Society clinical practice    guideline. JCEM. 2011 Jul; 96(7):1911-30.   NuSwab Vaginitis Plus (VG+)     Status: Abnormal   Collection Time: 10/30/24  3:35 PM  Result Value Ref Range   Atopobium vaginae High - 2 (A) Score   BVAB 2 Low - 0 Score   Megasphaera 1 Low - 0 Score    Comment: Calculate total score by adding the 3 individual bacterial vaginosis (BV) marker scores together.  Total score is interpreted as follows: Total score 0-1: Indicates the absence of BV. Total score   2: Indeterminate for BV. Additional clinical  data should be evaluated to establish a                  diagnosis. Total score 3-6: Indicates the presence of BV.    Candida albicans, NAA Negative Negative   Candida glabrata, NAA Negative Negative   Trich vag by NAA Negative Negative   Chlamydia trachomatis, NAA Negative Negative   Neisseria gonorrhoeae, NAA Negative Negative      Assessment & Plan:  Continue current medications.  Strict diet control emphasized.  Check potassium today. Problem List Items Addressed This Visit       Cardiovascular and Mediastinum   Essential hypertension, benign     Other   Prediabetes   Mixed hyperlipidemia   Hypokalemia - Primary   Relevant Orders   Potassium   Other  Visit Diagnoses       Medicare annual wellness visit, subsequent         BV (bacterial vaginosis)           Return in about 3 months (around 02/25/2025).   Total time spent: 30 minutes. This time includes review of previous notes and results and patient face to face interaction during today's visit.    FERNAND FREDY RAMAN, MD  11/27/2024   This document may have been prepared by [facility] Voice Recognition software and as such may include unintentional dictation errors.     [1] No Known Allergies [2]  Outpatient Medications Prior to Visit  Medication Sig   amLODipine  (NORVASC ) 5 MG tablet Take 1 tablet (5 mg total) by mouth daily. am   atorvastatin  (LIPITOR) 10 MG tablet TAKE ONE TABLET BY MOUTH ONCE DAILY   CALCIUM -VITAMIN D  PO Take by mouth.   hydrochlorothiazide  (HYDRODIURIL ) 25 MG tablet Take 1 tablet (25 mg total) by mouth daily. am   metoprolol  succinate (TOPROL -XL) 50 MG 24 hr tablet Take 50 mg by mouth daily.   Metoprolol  Succinate 50 MG CS24 Take 50 mg by mouth daily.   pantoprazole  (PROTONIX ) 40 MG tablet Take 1 tablet (40 mg total) by mouth daily.   potassium chloride  (KLOR-CON  M) 10 MEQ tablet Take 1 tablet on Monday, Wednesday, Friday   tiZANidine (ZANAFLEX) 2 MG tablet Take 2 mg by mouth at bedtime.   traMADol (ULTRAM) 50 MG tablet Take 50 mg by mouth 4 (four) times daily as needed.   traMADol (ULTRAM) 50 MG tablet Take 50 mg by mouth every 6 (six) hours.   Vitamin D , Ergocalciferol , 50000 units CAPS Take 1 tablet by mouth daily.   Multiple Vitamins-Minerals (MULTIPLE VITAMINS/WOMENS PO) Take 1 tablet by mouth daily. (Patient not taking: Reported on 11/27/2024)   No facility-administered medications prior to visit.   "

## 2024-11-28 LAB — POTASSIUM: Potassium: 3.3 mmol/L — ABNORMAL LOW (ref 3.5–5.2)

## 2024-11-30 ENCOUNTER — Ambulatory Visit: Payer: Self-pay | Admitting: Internal Medicine

## 2024-11-30 NOTE — Progress Notes (Signed)
"  Patient notified   "

## 2024-12-21 ENCOUNTER — Other Ambulatory Visit: Payer: Self-pay | Admitting: Cardiology

## 2025-03-14 ENCOUNTER — Ambulatory Visit: Admitting: Internal Medicine
